# Patient Record
Sex: Female | Born: 1955 | Race: Black or African American | Hispanic: No | Marital: Married | Smoking: Former smoker
Health system: Southern US, Community
[De-identification: ages and names within clinical notes are randomized; demographics above are authoritative.]

## PROBLEM LIST (undated history)

## (undated) DIAGNOSIS — Z860100 Personal history of colon polyps, unspecified: Secondary | ICD-10-CM

## (undated) DIAGNOSIS — E7800 Pure hypercholesterolemia, unspecified: Secondary | ICD-10-CM

## (undated) DIAGNOSIS — M810 Age-related osteoporosis without current pathological fracture: Secondary | ICD-10-CM

## (undated) DIAGNOSIS — E119 Type 2 diabetes mellitus without complications: Secondary | ICD-10-CM

## (undated) DIAGNOSIS — N189 Chronic kidney disease, unspecified: Secondary | ICD-10-CM

## (undated) DIAGNOSIS — J309 Allergic rhinitis, unspecified: Secondary | ICD-10-CM

## (undated) DIAGNOSIS — I10 Essential (primary) hypertension: Secondary | ICD-10-CM

## (undated) DIAGNOSIS — Z8619 Personal history of other infectious and parasitic diseases: Secondary | ICD-10-CM

## (undated) DIAGNOSIS — F419 Anxiety disorder, unspecified: Secondary | ICD-10-CM

## (undated) DIAGNOSIS — G4730 Sleep apnea, unspecified: Secondary | ICD-10-CM

## (undated) DIAGNOSIS — E755 Other lipid storage disorders: Secondary | ICD-10-CM

## (undated) DIAGNOSIS — A4902 Methicillin resistant Staphylococcus aureus infection, unspecified site: Secondary | ICD-10-CM

## (undated) DIAGNOSIS — K219 Gastro-esophageal reflux disease without esophagitis: Secondary | ICD-10-CM

## (undated) HISTORY — DX: Type 2 diabetes mellitus without complications: E11.9

## (undated) HISTORY — PX: DILATION AND CURETTAGE OF UTERUS: SHX78

## (undated) HISTORY — DX: Anxiety disorder, unspecified: F41.9

## (undated) HISTORY — PX: TUBAL LIGATION: SHX77

## (undated) HISTORY — DX: Pure hypercholesterolemia, unspecified: E78.00

## (undated) HISTORY — DX: Essential (primary) hypertension: I10

---

## 2004-06-14 ENCOUNTER — Ambulatory Visit: Payer: Self-pay

## 2005-03-30 ENCOUNTER — Ambulatory Visit: Payer: Self-pay

## 2005-09-20 ENCOUNTER — Ambulatory Visit: Payer: Self-pay | Admitting: Gastroenterology

## 2006-01-08 ENCOUNTER — Ambulatory Visit: Payer: Self-pay | Admitting: Urology

## 2006-04-16 ENCOUNTER — Ambulatory Visit: Payer: Self-pay | Admitting: Obstetrics and Gynecology

## 2006-04-26 ENCOUNTER — Ambulatory Visit: Payer: Self-pay | Admitting: Obstetrics and Gynecology

## 2006-09-16 ENCOUNTER — Emergency Department: Payer: Self-pay | Admitting: Emergency Medicine

## 2006-09-25 ENCOUNTER — Ambulatory Visit: Payer: Self-pay | Admitting: General Practice

## 2006-12-04 ENCOUNTER — Ambulatory Visit: Payer: Self-pay

## 2008-01-27 ENCOUNTER — Ambulatory Visit: Payer: Self-pay

## 2008-08-17 LAB — HM COLONOSCOPY

## 2009-03-16 ENCOUNTER — Ambulatory Visit: Payer: Self-pay

## 2009-03-28 ENCOUNTER — Ambulatory Visit: Payer: Self-pay | Admitting: Family Medicine

## 2009-03-31 ENCOUNTER — Ambulatory Visit: Payer: Self-pay

## 2009-08-17 ENCOUNTER — Ambulatory Visit: Payer: Self-pay | Admitting: General Practice

## 2010-04-18 ENCOUNTER — Ambulatory Visit: Payer: Self-pay

## 2010-05-19 ENCOUNTER — Ambulatory Visit
Admission: RE | Admit: 2010-05-19 | Discharge: 2010-05-19 | Disposition: A | Discharge status: Home / Self Care | Payer: PRIVATE HEALTH INSURANCE | Source: Ambulatory Visit | Attending: Neurosurgery | Admitting: Neurosurgery

## 2010-05-19 ENCOUNTER — Other Ambulatory Visit: Payer: Self-pay | Admitting: Neurosurgery

## 2010-05-19 DIAGNOSIS — M5410 Radiculopathy, site unspecified: Secondary | ICD-10-CM

## 2010-05-19 DIAGNOSIS — M549 Dorsalgia, unspecified: Secondary | ICD-10-CM

## 2010-07-05 ENCOUNTER — Other Ambulatory Visit (HOSPITAL_BASED_OUTPATIENT_CLINIC_OR_DEPARTMENT_OTHER): Payer: Self-pay | Admitting: Neurosurgery

## 2010-07-05 ENCOUNTER — Ambulatory Visit (HOSPITAL_COMMUNITY)
Admission: RE | Admit: 2010-07-05 | Discharge: 2010-07-05 | Disposition: A | Discharge status: Home / Self Care | Payer: PRIVATE HEALTH INSURANCE | Source: Ambulatory Visit | Attending: Neurosurgery | Admitting: Neurosurgery

## 2010-07-05 ENCOUNTER — Encounter (HOSPITAL_COMMUNITY)
Admission: RE | Admit: 2010-07-05 | Discharge: 2010-07-05 | Disposition: A | Discharge status: Home / Self Care | Payer: PRIVATE HEALTH INSURANCE | Source: Ambulatory Visit | Attending: Neurosurgery | Admitting: Neurosurgery

## 2010-07-05 DIAGNOSIS — Z01810 Encounter for preprocedural cardiovascular examination: Secondary | ICD-10-CM | POA: Insufficient documentation

## 2010-07-05 DIAGNOSIS — Z01811 Encounter for preprocedural respiratory examination: Secondary | ICD-10-CM

## 2010-07-05 DIAGNOSIS — Z01812 Encounter for preprocedural laboratory examination: Secondary | ICD-10-CM | POA: Insufficient documentation

## 2010-07-05 DIAGNOSIS — Z01818 Encounter for other preprocedural examination: Secondary | ICD-10-CM | POA: Insufficient documentation

## 2010-07-05 LAB — CBC
MCH: 28.9 pg (ref 26.0–34.0)
Platelets: 261 10*3/uL (ref 150–400)
RBC: 4.54 MIL/uL (ref 3.87–5.11)
WBC: 10.5 10*3/uL (ref 4.0–10.5)

## 2010-07-05 LAB — BASIC METABOLIC PANEL
Chloride: 103 mEq/L (ref 96–112)
Creatinine, Ser: 1.04 mg/dL (ref 0.4–1.2)
GFR calc Af Amer: >60 mL/min (ref >60)
GFR calc non Af Amer: 55 mL/min — ABNORMAL LOW (ref >60)
Potassium: 4 mEq/L (ref 3.5–5.1)

## 2010-07-05 LAB — SURGICAL PCR SCREEN: Staphylococcus aureus: POSITIVE — AB

## 2010-07-12 ENCOUNTER — Ambulatory Visit (HOSPITAL_COMMUNITY)
Admission: RE | Admit: 2010-07-12 | Discharge: 2010-07-13 | Disposition: A | Discharge status: Home / Self Care | Payer: PRIVATE HEALTH INSURANCE | Source: Ambulatory Visit | Attending: Neurosurgery | Admitting: Neurosurgery

## 2010-07-12 ENCOUNTER — Inpatient Hospital Stay (HOSPITAL_COMMUNITY): Payer: PRIVATE HEALTH INSURANCE

## 2010-07-12 DIAGNOSIS — Z01812 Encounter for preprocedural laboratory examination: Secondary | ICD-10-CM | POA: Insufficient documentation

## 2010-07-12 DIAGNOSIS — E669 Obesity, unspecified: Secondary | ICD-10-CM | POA: Insufficient documentation

## 2010-07-12 DIAGNOSIS — E119 Type 2 diabetes mellitus without complications: Secondary | ICD-10-CM | POA: Insufficient documentation

## 2010-07-12 DIAGNOSIS — E7800 Pure hypercholesterolemia, unspecified: Secondary | ICD-10-CM | POA: Insufficient documentation

## 2010-07-12 DIAGNOSIS — Q762 Congenital spondylolisthesis: Secondary | ICD-10-CM | POA: Insufficient documentation

## 2010-07-12 DIAGNOSIS — M7130 Other bursal cyst, unspecified site: Principal | ICD-10-CM | POA: Insufficient documentation

## 2010-07-12 HISTORY — PX: BACK SURGERY: SHX140

## 2010-07-12 LAB — GLUCOSE, CAPILLARY: Glucose-Capillary: 112 mg/dL — ABNORMAL HIGH (ref 70–99)

## 2010-07-13 LAB — GLUCOSE, CAPILLARY: Glucose-Capillary: 118 mg/dL — ABNORMAL HIGH (ref 70–99)

## 2010-07-21 ENCOUNTER — Emergency Department (HOSPITAL_COMMUNITY)
Admission: EM | Admit: 2010-07-21 | Discharge: 2010-07-21 | Disposition: A | Discharge status: Home / Self Care | Payer: PRIVATE HEALTH INSURANCE | Attending: Emergency Medicine | Admitting: Emergency Medicine

## 2010-07-21 DIAGNOSIS — I10 Essential (primary) hypertension: Secondary | ICD-10-CM | POA: Insufficient documentation

## 2010-07-21 DIAGNOSIS — Z79899 Other long term (current) drug therapy: Secondary | ICD-10-CM | POA: Insufficient documentation

## 2010-07-21 DIAGNOSIS — E119 Type 2 diabetes mellitus without complications: Secondary | ICD-10-CM | POA: Insufficient documentation

## 2010-07-21 DIAGNOSIS — M25519 Pain in unspecified shoulder: Secondary | ICD-10-CM | POA: Insufficient documentation

## 2010-07-30 NOTE — Op Note (Signed)
Lindsey Caldwell, Lindsey Caldwell              ACCOUNT NO.:  [id number]  MEDICAL RECORD NO.:  [id number]           PATIENT TYPE:  O  LOCATION:  3526                         FACILITY:  [facility]  PHYSICIAN:  Cristi Loron, M.D.DATE OF BIRTH:  07/26/1955  DATE OF PROCEDURE:  07/12/2010 DATE OF DISCHARGE:  07/13/2010                              OPERATIVE REPORT   BRIEF HISTORY:  The patient is a 55 year old black female who has suffered from back and left leg pain consistent with a lumbar radiculopathy.  She failed medical management and was worked up with a lumbar MRI, which demonstrated several cysts at L4-5 on the left.  I discussed various treatment options with the patient including surgery. She has weighed the risks, benefits, and alternatives of surgery, and decided to proceed with a left L4 laminotomy to decompress the left L4 and L5 nerve roots.  PREOPERATIVE DIAGNOSES:  L4-5 spondylolisthesis, synovial cyst,  spinal stenosis, lumbar radiculopathy, lumbago.  POSTOPERATIVE DIAGNOSES:  L4-5 spondylolisthesis, synovial cyst,  spinal stenosis, lumbar radiculopathy, lumbago.  PROCEDURE:  Left L4 laminotomy and foraminotomy with removal of synovial cyst using microdissection to decompress the left L4 and L5 nerve roots.  SURGEON:  Cristi Loron, MD  ASSISTANT:  Stefani Dama, MD  ANESTHESIA:  General endotracheal.  ESTIMATED BLOOD LOSS:  50 mL.  SPECIMENS:  None.  DRAINS:  None.  COMPLICATIONS:  None.  DESCRIPTION OF PROCEDURE:  The patient was brought to the operating room by anesthesia team.  General endotracheal anesthesia was induced.  The patient was then turned to prone position on Wilson frame.  The lumbosacral region was then prepared with Betadine scrub with Betadine solution.  Sterile drapes were applied and injected the area to be incised with Marcaine with epinephrine solution, used a scalpel to make a linear midline incision over the L4-5 interspace.  I  used electrocautery to perform a left-sided subperiosteal dissection exposing the left spinous process, lamina of L4 and L5.  We obtained intraoperative radiograph to confirm the location.  We then inserted McCullough retractor for exposure.  We then brought the microscope into the field and under its magnification and illumination, we completed the microdissection/decompression.  I used a high-speed drill to perform a left L4 laminotomy.  I widened the laminotomy with Kerrison punch and removed the left L4-5 ligamentum flavum.  I removed the cephalad aspect of the left L5 lamina.  I performed a foraminotomy about the left L5 nerve root.  We did encounter typical-appearing synovial cyst.  We used microdissection to free up from the epidural tissue and removed with Kerrison punch.  I also performed a foraminotomy about the left L4 nerve root.  This completed the decompression.  I inspected the L4-5 intervertebral disk.  There was no disk herniation.  We then palpated along the ventral surface of the thecal sac along the exit route of the left L4 and L5 nerve roots and noted that well decompressed.  We obtained hemostasis using bipolar cautery and then irrigated the wound out with bacitracin solution.  We then removed the retractor and then reapproximated the patient's thoracolumbar fascia with interrupted #1 Vicryl  suture, subcutaneous tissue with interrupted 2-0 Vicryl suture, and the skin with Steri-Strips and Benzoin.  The wound was then coated with bacitracin ointment and sterile dressing applied.  The drapes were removed.  The patient was subsequently returned to supine position where she was extubated by the Anesthesia team and transported to post anesthesia care unit in stable condition.  All sponge, instrument, and needle counts were correct at the end of this case.     Cristi Loron, M.D.     JDJ/MEDQ  D:  07/13/2010  T:  07/13/2010  Job:  [id number]  Electronically  Signed by Tressie Stalker M.D. on 07/30/2010 09:51:41 PM

## 2010-10-11 ENCOUNTER — Encounter: Payer: Self-pay | Admitting: Neurosurgery

## 2010-10-21 ENCOUNTER — Encounter: Payer: Self-pay | Admitting: Neurosurgery

## 2011-03-29 ENCOUNTER — Other Ambulatory Visit: Payer: Self-pay | Admitting: Neurosurgery

## 2011-03-29 DIAGNOSIS — M549 Dorsalgia, unspecified: Secondary | ICD-10-CM

## 2011-04-03 ENCOUNTER — Ambulatory Visit
Admission: RE | Admit: 2011-04-03 | Discharge: 2011-04-03 | Disposition: A | Discharge status: Home / Self Care | Payer: PRIVATE HEALTH INSURANCE | Source: Ambulatory Visit | Attending: Neurosurgery | Admitting: Neurosurgery

## 2011-04-03 DIAGNOSIS — M549 Dorsalgia, unspecified: Secondary | ICD-10-CM

## 2011-04-03 MED ORDER — GADOBENATE DIMEGLUMINE 529 MG/ML IV SOLN
19.0000 mL | Freq: Once | INTRAVENOUS | Status: AC | PRN
  Administered 2011-04-03: 19 mL via INTRAVENOUS

## 2011-07-04 ENCOUNTER — Ambulatory Visit: Payer: Self-pay | Admitting: Family Medicine

## 2011-07-13 ENCOUNTER — Ambulatory Visit: Payer: Self-pay | Admitting: Family Medicine

## 2012-04-23 ENCOUNTER — Ambulatory Visit: Payer: Self-pay | Admitting: Family Medicine

## 2012-05-21 ENCOUNTER — Emergency Department: Payer: Self-pay | Admitting: Emergency Medicine

## 2012-06-24 ENCOUNTER — Ambulatory Visit: Payer: Self-pay | Admitting: Unknown Physician Specialty

## 2012-07-08 ENCOUNTER — Ambulatory Visit: Payer: Self-pay | Admitting: Family Medicine

## 2012-07-16 ENCOUNTER — Ambulatory Visit: Payer: Self-pay | Admitting: General Practice

## 2012-07-17 ENCOUNTER — Ambulatory Visit: Payer: Self-pay | Admitting: Family Medicine

## 2012-07-21 LAB — HM MAMMOGRAPHY: HM MAMMO: NORMAL

## 2012-08-01 ENCOUNTER — Ambulatory Visit: Payer: Self-pay | Admitting: General Practice

## 2012-08-01 HISTORY — PX: KNEE SURGERY: SHX244

## 2012-12-17 LAB — HM PAP SMEAR: HM PAP: NORMAL

## 2013-01-09 ENCOUNTER — Ambulatory Visit: Payer: Self-pay | Admitting: Family Medicine

## 2013-02-13 ENCOUNTER — Ambulatory Visit (INDEPENDENT_AMBULATORY_CARE_PROVIDER_SITE_OTHER): Payer: PRIVATE HEALTH INSURANCE | Admitting: Podiatry

## 2013-02-13 ENCOUNTER — Encounter: Payer: Self-pay | Admitting: Podiatry

## 2013-02-13 ENCOUNTER — Ambulatory Visit (INDEPENDENT_AMBULATORY_CARE_PROVIDER_SITE_OTHER): Payer: PRIVATE HEALTH INSURANCE

## 2013-02-13 VITALS — BP 128/77 | HR 109 | Resp 16 | Ht 64.0 in | Wt 235.0 lb

## 2013-02-13 DIAGNOSIS — M79672 Pain in left foot: Secondary | ICD-10-CM

## 2013-02-13 DIAGNOSIS — M722 Plantar fascial fibromatosis: Secondary | ICD-10-CM

## 2013-02-13 DIAGNOSIS — M7750 Other enthesopathy of unspecified foot: Secondary | ICD-10-CM

## 2013-02-13 DIAGNOSIS — M79609 Pain in unspecified limb: Secondary | ICD-10-CM

## 2013-02-13 MED ORDER — TRIAMCINOLONE ACETONIDE 10 MG/ML IJ SUSP: 10.0000 mg | Freq: Once | INTRAMUSCULAR | Status: DC

## 2013-02-13 NOTE — Patient Instructions (Signed)
Plantar Fasciitis (Heel Spur Syndrome) with Rehab The plantar fascia is a fibrous, ligament-like, soft-tissue structure that spans the bottom of the foot. Plantar fasciitis is a condition that causes pain in the foot due to inflammation of the tissue. SYMPTOMS   Pain and tenderness on the underneath side of the foot.  Pain that worsens with standing or walking. CAUSES  Plantar fasciitis is caused by irritation and injury to the plantar fascia on the underneath side of the foot. Common mechanisms of injury include:  Direct trauma to bottom of the foot.  Damage to a small nerve that runs under the foot where the main fascia attaches to the heel bone.  Stress placed on the plantar fascia due to bone spurs. RISK INCREASES WITH:   Activities that place stress on the plantar fascia (running, jumping, pivoting, or cutting).  Poor strength and flexibility.  Improperly fitted shoes.  Tight calf muscles.  Flat feet.  Failure to warm-up properly before activity.  Obesity. PREVENTION  Warm up and stretch properly before activity.  Allow for adequate recovery between workouts.  Maintain physical fitness:  Strength, flexibility, and endurance.  Cardiovascular fitness.  Maintain a health body weight.  Avoid stress on the plantar fascia.  Wear properly fitted shoes, including arch supports for individuals who have flat feet. PROGNOSIS  If treated properly, then the symptoms of plantar fasciitis usually resolve without surgery. However, occasionally surgery is necessary. RELATED COMPLICATIONS   Recurrent symptoms that may result in a chronic condition.  Problems of the lower back that are caused by compensating for the injury, such as limping.  Pain or weakness of the foot during push-off following surgery.  Chronic inflammation, scarring, and partial or complete fascia tear, occurring more often from repeated injections. TREATMENT  Treatment initially involves the use of  ice and medication to help reduce pain and inflammation. The use of strengthening and stretching exercises may help reduce pain with activity, especially stretches of the Achilles tendon. These exercises may be performed at home or with a therapist. Your caregiver may recommend that you use heel cups of arch supports to help reduce stress on the plantar fascia. Occasionally, corticosteroid injections are given to reduce inflammation. If symptoms persist for greater than 6 months despite non-surgical (conservative), then surgery may be recommended.  MEDICATION   If pain medication is necessary, then nonsteroidal anti-inflammatory medications, such as aspirin and ibuprofen, or other minor pain relievers, such as acetaminophen, are often recommended.  Do not take pain medication within 7 days before surgery.  Prescription pain relievers may be given if deemed necessary by your caregiver. Use only as directed and only as much as you need.  Corticosteroid injections may be given by your caregiver. These injections should be reserved for the most serious cases, because they may only be given a certain number of times. HEAT AND COLD  Cold treatment (icing) relieves pain and reduces inflammation. Cold treatment should be applied for 10 to 15 minutes every 2 to 3 hours for inflammation and pain and immediately after any activity that aggravates your symptoms. Use ice packs or massage the area with a piece of ice (ice massage).  Heat treatment may be used prior to performing the stretching and strengthening activities prescribed by your caregiver, physical therapist, or athletic trainer. Use a heat pack or soak the injury in warm water. SEEK IMMEDIATE MEDICAL CARE IF:  Treatment seems to offer no benefit, or the condition worsens.  Any medications produce adverse side effects. EXERCISES RANGE   OF MOTION (ROM) AND STRETCHING EXERCISES - Plantar Fasciitis (Heel Spur Syndrome) These exercises may help you  when beginning to rehabilitate your injury. Your symptoms may resolve with or without further involvement from your physician, physical therapist or athletic trainer. While completing these exercises, remember:   Restoring tissue flexibility helps normal motion to return to the joints. This allows healthier, less painful movement and activity.  An effective stretch should be held for at least 30 seconds.  A stretch should never be painful. You should only feel a gentle lengthening or release in the stretched tissue. RANGE OF MOTION - Toe Extension, Flexion  Sit with your right / left leg crossed over your opposite knee.  Grasp your toes and gently pull them back toward the top of your foot. You should feel a stretch on the bottom of your toes and/or foot.  Hold this stretch for __________ seconds.  Now, gently pull your toes toward the bottom of your foot. You should feel a stretch on the top of your toes and or foot.  Hold this stretch for __________ seconds. Repeat __________ times. Complete this stretch __________ times per day.  RANGE OF MOTION - Ankle Dorsiflexion, Active Assisted  Remove shoes and sit on a chair that is preferably not on a carpeted surface.  Place right / left foot under knee. Extend your opposite leg for support.  Keeping your heel down, slide your right / left foot back toward the chair until you feel a stretch at your ankle or calf. If you do not feel a stretch, slide your bottom forward to the edge of the chair, while still keeping your heel down.  Hold this stretch for __________ seconds. Repeat __________ times. Complete this stretch __________ times per day.  STRETCH  Gastroc, Standing  Place hands on wall.  Extend right / left leg, keeping the front knee somewhat bent.  Slightly point your toes inward on your back foot.  Keeping your right / left heel on the floor and your knee straight, shift your weight toward the wall, not allowing your back to  arch.  You should feel a gentle stretch in the right / left calf. Hold this position for __________ seconds. Repeat __________ times. Complete this stretch __________ times per day. STRETCH  Soleus, Standing  Place hands on wall.  Extend right / left leg, keeping the other knee somewhat bent.  Slightly point your toes inward on your back foot.  Keep your right / left heel on the floor, bend your back knee, and slightly shift your weight over the back leg so that you feel a gentle stretch deep in your back calf.  Hold this position for __________ seconds. Repeat __________ times. Complete this stretch __________ times per day. STRETCH  Gastrocsoleus, Standing  Note: This exercise can place a lot of stress on your foot and ankle. Please complete this exercise only if specifically instructed by your caregiver.   Place the ball of your right / left foot on a step, keeping your other foot firmly on the same step.  Hold on to the wall or a rail for balance.  Slowly lift your other foot, allowing your body weight to press your heel down over the edge of the step.  You should feel a stretch in your right / left calf.  Hold this position for __________ seconds.  Repeat this exercise with a slight bend in your right / left knee. Repeat __________ times. Complete this stretch __________ times per day.    STRENGTHENING EXERCISES - Plantar Fasciitis (Heel Spur Syndrome)  These exercises may help you when beginning to rehabilitate your injury. They may resolve your symptoms with or without further involvement from your physician, physical therapist or athletic trainer. While completing these exercises, remember:   Muscles can gain both the endurance and the strength needed for everyday activities through controlled exercises.  Complete these exercises as instructed by your physician, physical therapist or athletic trainer. Progress the resistance and repetitions only as guided. STRENGTH - Towel  Curls  Sit in a chair positioned on a non-carpeted surface.  Place your foot on a towel, keeping your heel on the floor.  Pull the towel toward your heel by only curling your toes. Keep your heel on the floor.  If instructed by your physician, physical therapist or athletic trainer, add ____________________ at the end of the towel. Repeat __________ times. Complete this exercise __________ times per day. STRENGTH - Ankle Inversion  Secure one end of a rubber exercise band/tubing to a fixed object (table, pole). Loop the other end around your foot just before your toes.  Place your fists between your knees. This will focus your strengthening at your ankle.  Slowly, pull your big toe up and in, making sure the band/tubing is positioned to resist the entire motion.  Hold this position for __________ seconds.  Have your muscles resist the band/tubing as it slowly pulls your foot back to the starting position. Repeat __________ times. Complete this exercises __________ times per day.  Document Released: 02/05/2005 Document Revised: 04/30/2011 Document Reviewed: 05/20/2008 [employer] Patient Information 2014 [employer]. Plantar Fasciitis Plantar fasciitis is a common condition that causes foot pain. It is soreness (inflammation) of the band of tough fibrous tissue on the bottom of the foot that runs from the heel bone (calcaneus) to the ball of the foot. The cause of this soreness may be from excessive standing, poor fitting shoes, running on hard surfaces, being overweight, having an abnormal walk, or overuse (this is common in runners) of the painful foot or feet. It is also common in aerobic exercise dancers and ballet dancers. SYMPTOMS  Most people with plantar fasciitis complain of:  Severe pain in the morning on the bottom of their foot especially when taking the first steps out of bed. This pain recedes after a few minutes of walking.  Severe pain is experienced also during walking  following a long period of inactivity.  Pain is worse when walking barefoot or up stairs DIAGNOSIS   Your caregiver will diagnose this condition by examining and feeling your foot.  Special tests such as X-rays of your foot, are usually not needed. PREVENTION   Consult a sports medicine professional before beginning a new exercise program.  Walking programs offer a good workout. With walking there is a lower chance of overuse injuries common to runners. There is less impact and less jarring of the joints.  Begin all new exercise programs slowly. If problems or pain develop, decrease the amount of time or distance until you are at a comfortable level.  Wear good shoes and replace them regularly.  Stretch your foot and the heel cords at the back of the ankle (Achilles tendon) both before and after exercise.  Run or exercise on even surfaces that are not hard. For example, asphalt is better than pavement.  Do not run barefoot on hard surfaces.  If using a treadmill, vary the incline.  Do not continue to workout if you have foot or joint   problems. Seek professional help if they do not improve. HOME CARE INSTRUCTIONS   Avoid activities that cause you pain until you recover.  Use ice or cold packs on the problem or painful areas after working out.  Only take over-the-counter or prescription medicines for pain, discomfort, or fever as directed by your caregiver.  Soft shoe inserts or athletic shoes with air or gel sole cushions may be helpful.  If problems continue or become more severe, consult a sports medicine caregiver or your own health care provider. Cortisone is a potent anti-inflammatory medication that may be injected into the painful area. You can discuss this treatment with your caregiver. MAKE SURE YOU:   Understand these instructions.  Will watch your condition.  Will get help right away if you are not doing well or get worse. Document Released: 10/31/2000 Document  Revised: 04/30/2011 Document Reviewed: 12/31/2007 [employer] Patient Information 2014 [employer].  

## 2013-02-13 NOTE — Progress Notes (Signed)
   Subjective:    Patient ID: Lindsey Caldwell, female    DOB: [birth year], 57 y.o.   MRN: [id number]  HPI Comments: Right heel pain , left top of foot pain with burning   Foot Pain      Review of Systems     Objective:   Physical Exam        Assessment & Plan:

## 2013-02-13 NOTE — Progress Notes (Signed)
Subjective:     Patient ID: Lindsey Caldwell, female   DOB: [birth year], 57 y.o.   MRN: [id number]  HPI patient presents stating that she has had problems with her heel for several months and burring on top of her left foot with pain early in the morning and after periods of sitting and now starting to hurt all day   Review of Systems     Objective:   Physical Exam Neurovascular status intact with no health history changes noted with discomfort aspect right heel at the insertional point of the tendon into the calcaneus with mild inflammation dorsal left    Assessment:     Plantar fasciitis right with possible tendinitis dorsal left with structural malalignment of the foot noted    Plan:     H&P reviewed and x-rays reviewed. Injected the plantar fascia right 3 mg Kenalog 5 mg Xylocaine Marcaine mixture and advised on do orthotics as they have worn out from several years ago. Dispensed ankle brace to support the arch and reappoint when orthotics are ready

## 2013-02-16 LAB — HM DIABETES EYE EXAM

## 2013-03-02 ENCOUNTER — Telehealth: Payer: Self-pay | Admitting: *Deleted

## 2013-03-02 NOTE — Telephone Encounter (Signed)
Called and l/m wanting to know shoe size for orthotics. [facility] is needing shoe size.

## 2013-03-23 ENCOUNTER — Encounter: Payer: Self-pay | Admitting: *Deleted

## 2013-03-23 NOTE — Progress Notes (Signed)
Pt picked up orthotics on Friday 1.30.15

## 2013-04-03 ENCOUNTER — Ambulatory Visit (INDEPENDENT_AMBULATORY_CARE_PROVIDER_SITE_OTHER): Payer: PRIVATE HEALTH INSURANCE

## 2013-04-03 ENCOUNTER — Encounter: Payer: Self-pay | Admitting: Podiatry

## 2013-04-03 ENCOUNTER — Ambulatory Visit (INDEPENDENT_AMBULATORY_CARE_PROVIDER_SITE_OTHER): Payer: PRIVATE HEALTH INSURANCE | Admitting: Podiatry

## 2013-04-03 VITALS — BP 161/95 | HR 89 | Resp 16 | Ht 64.0 in | Wt 240.0 lb

## 2013-04-03 DIAGNOSIS — M7750 Other enthesopathy of unspecified foot: Secondary | ICD-10-CM

## 2013-04-03 DIAGNOSIS — M25579 Pain in unspecified ankle and joints of unspecified foot: Secondary | ICD-10-CM

## 2013-04-03 MED ORDER — DICLOFENAC SODIUM 75 MG PO TBEC: 75.0000 mg | DELAYED_RELEASE_TABLET | Freq: Two times a day (BID) | ORAL | Status: DC

## 2013-04-03 MED ORDER — TRIAMCINOLONE ACETONIDE 10 MG/ML IJ SUSP
10.0000 mg | Freq: Once | INTRAMUSCULAR | Status: AC
  Administered 2013-04-03: 10 mg

## 2013-04-03 NOTE — Progress Notes (Signed)
Subjective:     Patient ID: Lindsey Caldwell, female   DOB: 30-Jan-1956, 58 y.o.   MRN: [id number]  HPI patient states the heel is doing pretty well but she is getting pain on the inside of the right ankle which has made it hard for her to do a lot of walking or to work comfortably. Does not remember injury   Review of Systems     Objective:   Physical Exam Neurovascular status intact with muscle strength adequate and discomfort and swelling around the posterior tibial tendon as it goes under the medial malleolus and inserts into the navicular. No indications of tear and plantar heel is doing well    Assessment:     Probable posterior tibial tendinitis right with also mild discomfort lateral ankle secondary to gait change    Plan:     Careful sheath injection of the right posterior tibial 3 mg Kenalog 5 mg Xylocaine and advised him reduced activity and supportive inserts usage. Reappoint her recheck in the next several weeks

## 2013-04-17 ENCOUNTER — Ambulatory Visit: Payer: PRIVATE HEALTH INSURANCE | Admitting: Podiatry

## 2013-04-24 ENCOUNTER — Ambulatory Visit: Payer: PRIVATE HEALTH INSURANCE | Admitting: Podiatry

## 2013-06-17 ENCOUNTER — Telehealth: Payer: Self-pay | Admitting: *Deleted

## 2013-06-17 NOTE — Telephone Encounter (Signed)
PT CALLED SAID ORTHOTICS ARE TEARING AND SHE JUST PICKED THEM UP ON 1.13.15. TOLD PT TO BRING THEM BY OFFICE AND WE WILL SEND THEM OUT TO BE REPAIRED. PT SAID SHE WOULD BRING THEM BY ON Monday.

## 2013-07-06 ENCOUNTER — Encounter: Payer: Self-pay | Admitting: *Deleted

## 2013-07-06 NOTE — Progress Notes (Signed)
Sent pt post card letting her know orthotics are here. 

## 2013-07-10 ENCOUNTER — Encounter: Payer: Self-pay | Admitting: Podiatry

## 2013-07-23 ENCOUNTER — Ambulatory Visit: Payer: PRIVATE HEALTH INSURANCE | Admitting: Internal Medicine

## 2013-08-17 ENCOUNTER — Ambulatory Visit (INDEPENDENT_AMBULATORY_CARE_PROVIDER_SITE_OTHER): Payer: PRIVATE HEALTH INSURANCE | Admitting: Internal Medicine

## 2013-08-17 ENCOUNTER — Encounter: Payer: Self-pay | Admitting: Internal Medicine

## 2013-08-17 VITALS — BP 142/76 | HR 96 | Temp 98.5°F | Ht 63.5 in | Wt 246.8 lb

## 2013-08-17 DIAGNOSIS — M5442 Lumbago with sciatica, left side: Secondary | ICD-10-CM | POA: Insufficient documentation

## 2013-08-17 DIAGNOSIS — I10 Essential (primary) hypertension: Secondary | ICD-10-CM | POA: Insufficient documentation

## 2013-08-17 DIAGNOSIS — G4700 Insomnia, unspecified: Secondary | ICD-10-CM | POA: Insufficient documentation

## 2013-08-17 DIAGNOSIS — E119 Type 2 diabetes mellitus without complications: Secondary | ICD-10-CM | POA: Insufficient documentation

## 2013-08-17 DIAGNOSIS — M545 Low back pain, unspecified: Secondary | ICD-10-CM

## 2013-08-17 DIAGNOSIS — K219 Gastro-esophageal reflux disease without esophagitis: Secondary | ICD-10-CM

## 2013-08-17 MED ORDER — DEXLANSOPRAZOLE 60 MG PO CPDR: 60.0000 mg | DELAYED_RELEASE_CAPSULE | Freq: Every day | ORAL | Status: DC

## 2013-08-17 MED ORDER — ESZOPICLONE 3 MG PO TABS: 3.0000 mg | ORAL_TABLET | Freq: Every day | ORAL | Status: DC

## 2013-08-17 MED ORDER — METFORMIN HCL 500 MG PO TABS: 500.0000 mg | ORAL_TABLET | Freq: Every day | ORAL | Status: DC

## 2013-08-17 NOTE — Assessment & Plan Note (Signed)
Low back pain, s/p surgical resection of synovial cyst in the past. Follow up with neurosurgery pending. Continue prn flexeril.

## 2013-08-17 NOTE — Assessment & Plan Note (Signed)
Symptoms well controlled with Dexilant. Will continue. 

## 2013-08-17 NOTE — Assessment & Plan Note (Signed)
Symptoms poorly controlled on Ambien. Will try change to [facility]. 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 Follow up in 4 weeks.

## 2013-08-17 NOTE — Progress Notes (Signed)
Pre visit review using our clinic review tool, if applicable. No additional management support is needed unless otherwise documented below in the visit note. 

## 2013-08-17 NOTE — Patient Instructions (Signed)
Stop Ambien.  Start Lunesta 3mg  at bedtime.  Follow up 4 weeks and sooner as needed.

## 2013-08-17 NOTE — Assessment & Plan Note (Signed)
BP Readings from Last 3 Encounters:  08/17/13 142/76  04/03/13 161/95  02/13/13 128/77   BP well controlled per pt report. Will check renal function with labs. Will continue Avalide.

## 2013-08-17 NOTE — Assessment & Plan Note (Signed)
Wt Readings from Last 3 Encounters:  08/17/13 246 lb 12 oz (111.925 kg)  04/03/13 240 lb (108.863 kg)  02/13/13 235 lb (106.595 kg)   Body mass index is 43.02 kg/(m^2). Encouraged healthy diet and regular exercise. Will set up referral with surgery to discuss gastric bypass or sleeve.

## 2013-08-17 NOTE — Assessment & Plan Note (Signed)
Will check A1c with labs. Eye exam UTD. Foot exam normal today. Continue Metformin. Will request previous records including vaccination records.

## 2013-08-17 NOTE — Progress Notes (Signed)
Subjective:    Patient ID: Lindsey Caldwell, female    DOB: April 24, 1955, 58 y.o.   MRN: [id number]  HPI 58YO female presents to established care.  DM - A1c last check 6.1%. Compliant with meds. Diagnosed about 10 years ago.   Obesity - trying to lose weight, but has struggled despite efforts at healthy diet. Does not exercise. Interested in considering bariatric surgery.  Insomnia - working 7 days on 7 nights on. Has trouble sleeping. Taking Ambien 10mg  daily but only sleeps about 4 hours.  HTN - 130/70s. Compliant with meds.  Review of Systems  Constitutional: Negative for fever, chills, appetite change, fatigue and unexpected weight change.  Eyes: Negative for visual disturbance.  Respiratory: Negative for shortness of breath.   Cardiovascular: Negative for chest pain and leg swelling.  Gastrointestinal: Negative for nausea, vomiting, abdominal pain, diarrhea and constipation.  Musculoskeletal: Positive for arthralgias, back pain and myalgias.  Skin: Negative for color change and rash.  Hematological: Negative for adenopathy. Does not bruise/bleed easily.  Psychiatric/Behavioral: Positive for sleep disturbance. Negative for dysphoric mood. The patient is not nervous/anxious.        Objective:    BP 142/76  Pulse 96  Temp(Src) 98.5 F (36.9 C) (Oral)  Ht 5' 3.5" (1.613 m)  Wt 246 lb 12 oz (111.925 kg)  BMI 43.02 kg/m2  SpO2 94% Physical Exam  Constitutional: She is oriented to person, place, and time. She appears well-developed and well-nourished. No distress.  HENT:  Head: Normocephalic and atraumatic.  Right Ear: External ear normal.  Left Ear: External ear normal.  Nose: Nose normal.  Mouth/Throat: Oropharynx is clear and moist. No oropharyngeal exudate.  Eyes: Conjunctivae and EOM are normal. Pupils are equal, round, and reactive to light. Right eye exhibits no discharge. Left eye exhibits no discharge. No scleral icterus.  Neck: Normal range of motion. Neck supple.  No tracheal deviation present. No thyromegaly present.  Cardiovascular: Normal rate, regular rhythm, normal heart sounds and intact distal pulses.  Exam reveals no gallop and no friction rub.   No murmur heard. Pulmonary/Chest: Effort normal and breath sounds normal. No accessory muscle usage. Not tachypneic. No respiratory distress. She has no decreased breath sounds. She has no wheezes. She has no rhonchi. She has no rales. She exhibits no tenderness.  Abdominal: Soft. Bowel sounds are normal. She exhibits no distension and no mass. There is no tenderness. There is no rebound and no guarding.  Musculoskeletal: She exhibits no edema.       Lumbar back: She exhibits decreased range of motion, tenderness and pain.       Back:  Lymphadenopathy:    She has no cervical adenopathy.  Neurological: She is alert and oriented to person, place, and time. No cranial nerve deficit. She exhibits normal muscle tone. Coordination normal.  Skin: Skin is warm and dry. No rash noted. She is not diaphoretic. No erythema. No pallor.  Psychiatric: She has a normal mood and affect. Her behavior is normal. Judgment and thought content normal.          Assessment & Plan:   Problem List Items Addressed This Visit     Unprioritized   Diabetes type 2, controlled - Primary     Will check A1c with labs. Eye exam UTD. Foot exam normal today. Continue Metformin. Will request previous records including vaccination records.    Relevant Medications      metFORMIN (GLUCOPHAGE) tablet   Gastroesophageal reflux disease without esophagitis  Symptoms well controlled with Dexilant. Will continue.    Relevant Medications      dexlansoprazole (DEXILANT) 60 MG capsule   Hypertension      BP Readings from Last 3 Encounters:  08/17/13 142/76  04/03/13 161/95  02/13/13 128/77   BP well controlled per pt report. Will check renal function with labs. Will continue Avalide.    Insomnia     Symptoms poorly controlled  on Ambien. Will try change to [facility]. 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 Follow up in 4 weeks.    Relevant Medications      Eszopiclone (ESZOPICLONE) 3 MG TABS   Left-sided low back pain without sciatica     Low back pain, s/p surgical resection of synovial cyst in the past. Follow up with neurosurgery pending. Continue prn flexeril.    Relevant Medications      cyclobenzaprine (FLEXERIL) 10 MG tablet   Morbid obesity      Wt Readings from Last 3 Encounters:  08/17/13 246 lb 12 oz (111.925 kg)  04/03/13 240 lb (108.863 kg)  02/13/13 235 lb (106.595 kg)   Body mass index is 43.02 kg/(m^2). Encouraged healthy diet and regular exercise. Will set up referral with surgery to discuss gastric bypass or sleeve.    Relevant Medications      metFORMIN (GLUCOPHAGE) tablet   Other Relevant Orders      Ambulatory referral to General Surgery       Return in about 4 weeks (around 09/14/2013) for Recheck.

## 2013-08-26 LAB — HEPATIC FUNCTION PANEL
ALT: 26 U/L (ref 7–35)
AST: 25 U/L (ref 13–35)
Alkaline Phosphatase: 83 U/L (ref 25–125)
Bilirubin, Total: 0.5 mg/dL

## 2013-08-26 LAB — LIPID PANEL
CHOLESTEROL: 160 mg/dL (ref 0–200)
HDL: 40 mg/dL (ref 35–70)
LDL Cholesterol: 97 mg/dL
Triglycerides: 114 mg/dL (ref 40–160)

## 2013-08-26 LAB — BASIC METABOLIC PANEL
BUN: 17 mg/dL (ref 4–21)
Creatinine: 1.3 mg/dL — AB (ref <=1.1)
Glucose: 99 mg/dL
Potassium: 3.9 mmol/L (ref 3.4–5.3)
Sodium: 139 mmol/L (ref 137–147)

## 2013-08-26 LAB — HEMOGLOBIN A1C: Hgb A1c MFr Bld: 5.9 % (ref 4.0–6.0)

## 2013-08-26 LAB — TSH: TSH: 1.5 u[IU]/mL (ref <=5.90)

## 2013-08-27 ENCOUNTER — Telehealth: Payer: Self-pay | Admitting: Internal Medicine

## 2013-08-27 NOTE — Telephone Encounter (Signed)
Labs from 5/9 show decrease in kidney function with Cr 1.32. I do not have recent labs to compare to see if this is stable for her. I would like for her to hold her Metformin for now.  -Does she follow with a kidney specialist? -We need to repeat BMP in 2 weeks

## 2013-08-28 NOTE — Telephone Encounter (Signed)
Notified pt. 

## 2013-08-31 ENCOUNTER — Encounter: Payer: Self-pay | Admitting: *Deleted

## 2013-09-01 ENCOUNTER — Encounter: Payer: Self-pay | Admitting: *Deleted

## 2013-09-01 NOTE — Progress Notes (Signed)
Chart reviewed for diabetic bundle. Pt has appt currently scheduled for 09/23/13, BP will be rechecked at that appt. a1c and LDL within parameters.

## 2013-09-15 ENCOUNTER — Ambulatory Visit: Payer: Self-pay | Admitting: Family Medicine

## 2013-09-16 LAB — HM MAMMOGRAPHY: HM Mammogram: NEGATIVE

## 2013-09-16 LAB — BASIC METABOLIC PANEL
BUN: 24 mg/dL — AB (ref 4–21)
CREATININE: 1.4 mg/dL — AB (ref 0.5–1.1)
Glucose: 109 mg/dL
POTASSIUM: 4.4 mmol/L (ref 3.4–5.3)
Sodium: 138 mmol/L (ref 137–147)

## 2013-09-17 ENCOUNTER — Encounter: Payer: Self-pay | Admitting: *Deleted

## 2013-09-23 ENCOUNTER — Encounter: Payer: Self-pay | Admitting: Internal Medicine

## 2013-09-23 ENCOUNTER — Ambulatory Visit (INDEPENDENT_AMBULATORY_CARE_PROVIDER_SITE_OTHER): Payer: PRIVATE HEALTH INSURANCE | Admitting: Internal Medicine

## 2013-09-23 VITALS — BP 120/66 | HR 100 | Temp 98.7°F | Ht 63.5 in | Wt 248.8 lb

## 2013-09-23 DIAGNOSIS — B9789 Other viral agents as the cause of diseases classified elsewhere: Secondary | ICD-10-CM

## 2013-09-23 DIAGNOSIS — N183 Chronic kidney disease, stage 3 unspecified: Secondary | ICD-10-CM

## 2013-09-23 DIAGNOSIS — E119 Type 2 diabetes mellitus without complications: Secondary | ICD-10-CM

## 2013-09-23 DIAGNOSIS — J069 Acute upper respiratory infection, unspecified: Secondary | ICD-10-CM

## 2013-09-23 DIAGNOSIS — I10 Essential (primary) hypertension: Secondary | ICD-10-CM

## 2013-09-23 MED ORDER — BENZONATATE 200 MG PO CAPS: 200.0000 mg | ORAL_CAPSULE | Freq: Two times a day (BID) | ORAL | Status: DC | PRN

## 2013-09-23 NOTE — Progress Notes (Signed)
Pre visit review using our clinic review tool, if applicable. No additional management support is needed unless otherwise documented below in the visit note. 

## 2013-09-23 NOTE — Assessment & Plan Note (Signed)
Lab Results  Component Value Date   HGBA1C 5.9 08/26/2013   BG well controlled off Metformin. Will continue to monitor closely and repeat A1c in 3 months.

## 2013-09-23 NOTE — Progress Notes (Signed)
Subjective:    Patient ID: Lindsey Caldwell, female    DOB: Aug 16, 1955, 58 y.o.   MRN: [id number]  HPI 58YO female presents for follow up.  DM - Stopped metformin. BG near 115-125. Had single reading of 190.  Trying to follow a healthier diet and start exercising by swimming.  Concerned about slight decline in kidney function noted on labs. Stopped taking Metformin and Allopurinol.  Recent symptoms of clear nasal drainage and dry cough about 2 days. No fever, chills, dyspnea. No known sick contacts.  Review of Systems  Constitutional: Negative for fever, chills, appetite change, fatigue and unexpected weight change.  Eyes: Negative for visual disturbance.  Respiratory: Negative for shortness of breath.   Cardiovascular: Negative for chest pain and leg swelling.  Gastrointestinal: Negative for abdominal pain.  Skin: Negative for color change and rash.  Hematological: Negative for adenopathy. Does not bruise/bleed easily.  Psychiatric/Behavioral: Negative for dysphoric mood. The patient is not nervous/anxious.        Objective:    BP 120/66  Pulse 100  Temp(Src) 98.7 F (37.1 C) (Oral)  Ht 5' 3.5" (1.613 m)  Wt 248 lb 12 oz (112.832 kg)  BMI 43.37 kg/m2  SpO2 96% Physical Exam  Constitutional: She is oriented to person, place, and time. She appears well-developed and well-nourished. No distress.  HENT:  Head: Normocephalic and atraumatic.  Right Ear: External ear normal.  Left Ear: External ear normal.  Nose: Nose normal.  Mouth/Throat: Oropharynx is clear and moist. No oropharyngeal exudate.  Eyes: Conjunctivae are normal. Pupils are equal, round, and reactive to light. Right eye exhibits no discharge. Left eye exhibits no discharge. No scleral icterus.  Neck: Normal range of motion. Neck supple. No tracheal deviation present. No thyromegaly present.  Cardiovascular: Normal rate, regular rhythm, normal heart sounds and intact distal pulses.  Exam reveals no gallop and no  friction rub.   No murmur heard. Pulmonary/Chest: Effort normal and breath sounds normal. No accessory muscle usage. Not tachypneic. No respiratory distress. She has no decreased breath sounds. She has no wheezes. She has no rhonchi. She has no rales. She exhibits no tenderness.  Musculoskeletal: Normal range of motion. She exhibits no edema and no tenderness.  Lymphadenopathy:    She has no cervical adenopathy.  Neurological: She is alert and oriented to person, place, and time. No cranial nerve deficit. She exhibits normal muscle tone. Coordination normal.  Skin: Skin is warm and dry. No rash noted. She is not diaphoretic. No erythema. No pallor.  Psychiatric: She has a normal mood and affect. Her behavior is normal. Judgment and thought content normal.          Assessment & Plan:   Problem List Items Addressed This Visit     Unprioritized   Chronic kidney disease (CKD), stage III (moderate)     Reviewed kidney function from previous labs in 2014 through present. Cr baseline 1.3-1.4. GFR near 40-55. Stopped Metformin. Stopped Allopurinol. Discussed stopping HCTZ, but will hold for now. Encouraged her to stop taking Ibuprofen. Follow up renal function in 3 months.    Diabetes type 2, controlled - Primary      Lab Results  Component Value Date   HGBA1C 5.9 08/26/2013   BG well controlled off Metformin. Will continue to monitor closely and repeat A1c in 3 months.    Relevant Orders      Ambulatory referral to diabetic education   Hypertension      BP Readings from Last 3  Encounters:  09/23/13 120/66  08/17/13 142/76  04/03/13 161/95   BP well controlled with current medications. Will continue.    Morbid obesity      Wt Readings from Last 3 Encounters:  09/23/13 248 lb 12 oz (112.832 kg)  08/17/13 246 lb 12 oz (111.925 kg)  04/03/13 240 lb (108.863 kg)   Body mass index is 43.37 kg/(m^2). Encouraged healthy diet and exercise such as swimming with goal of weight loss.      Viral URI with cough     Symptoms most consistent with Viral URI with cough. Encouraged rest and adequate fluid intake. Will start Tessalon as needed for cough. Follow up if symptoms are not improving.    Relevant Medications      benzonatate (TESSALON) capsule       Return in about 3 months (around 12/24/2013) for Recheck of Diabetes.

## 2013-09-23 NOTE — Assessment & Plan Note (Signed)
Symptoms most consistent with Viral URI with cough. Encouraged rest and adequate fluid intake. Will start Tessalon as needed for cough. Follow up if symptoms are not improving.

## 2013-09-23 NOTE — Assessment & Plan Note (Signed)
Wt Readings from Last 3 Encounters:  09/23/13 248 lb 12 oz (112.832 kg)  08/17/13 246 lb 12 oz (111.925 kg)  04/03/13 240 lb (108.863 kg)   Body mass index is 43.37 kg/(m^2). Encouraged healthy diet and exercise such as swimming with goal of weight loss.

## 2013-09-23 NOTE — Assessment & Plan Note (Signed)
BP Readings from Last 3 Encounters:  09/23/13 120/66  08/17/13 142/76  04/03/13 161/95   BP well controlled with current medications. Will continue.

## 2013-09-23 NOTE — Patient Instructions (Addendum)
Stop Ibuprofen.  Use Tylenol 1000mg  up to three times daily for back pain.  Use Flexeril as needed for back spasm at night.  Use Tessalon up to twice daily for cough. Call if symptoms are not improving by the end of this week.  Repeat labs in 3 months.

## 2013-09-23 NOTE — Assessment & Plan Note (Signed)
Reviewed kidney function from previous labs in 2014 through present. Cr baseline 1.3-1.4. GFR near 40-55. Stopped Metformin. Stopped Allopurinol. Discussed stopping HCTZ, but will hold for now. Encouraged her to stop taking Ibuprofen. Follow up renal function in 3 months.

## 2013-09-24 ENCOUNTER — Telehealth: Payer: Self-pay | Admitting: Internal Medicine

## 2013-09-24 NOTE — Telephone Encounter (Signed)
Relevant patient education assigned to patient using Emmi. ° °

## 2013-09-25 ENCOUNTER — Encounter: Payer: Self-pay | Admitting: Internal Medicine

## 2013-10-29 ENCOUNTER — Other Ambulatory Visit: Payer: Self-pay | Admitting: *Deleted

## 2013-10-29 MED ORDER — IRBESARTAN-HYDROCHLOROTHIAZIDE 150-12.5 MG PO TABS: 1.0000 | ORAL_TABLET | Freq: Every day | ORAL | Status: DC

## 2013-11-09 ENCOUNTER — Encounter: Payer: Self-pay | Admitting: *Deleted

## 2013-12-25 ENCOUNTER — Ambulatory Visit: Payer: PRIVATE HEALTH INSURANCE | Admitting: Internal Medicine

## 2014-01-13 LAB — BASIC METABOLIC PANEL
BUN: 18 mg/dL (ref 4–21)
Creatinine: 1.2 mg/dL — AB (ref 0.5–1.1)
Glucose: 87 mg/dL
Potassium: 3.8 mmol/L (ref 3.4–5.3)
Sodium: 141 mmol/L (ref 137–147)

## 2014-01-13 LAB — HEPATIC FUNCTION PANEL
ALT: 25 U/L (ref 7–35)
AST: 22 U/L (ref 13–35)
Alkaline Phosphatase: 84 U/L (ref 25–125)

## 2014-01-13 LAB — LIPID PANEL
Cholesterol: 177 mg/dL (ref 0–200)
HDL: 43 mg/dL (ref 35–70)
TRIGLYCERIDES: 100 mg/dL (ref 40–160)

## 2014-01-13 LAB — HEMOGLOBIN A1C: Hgb A1c MFr Bld: 6.2 % — AB (ref 4.0–6.0)

## 2014-01-19 ENCOUNTER — Encounter: Payer: PRIVATE HEALTH INSURANCE | Admitting: Internal Medicine

## 2014-01-25 ENCOUNTER — Encounter: Payer: Self-pay | Admitting: *Deleted

## 2014-01-25 ENCOUNTER — Ambulatory Visit (INDEPENDENT_AMBULATORY_CARE_PROVIDER_SITE_OTHER): Payer: PRIVATE HEALTH INSURANCE | Admitting: Internal Medicine

## 2014-01-25 ENCOUNTER — Encounter: Payer: Self-pay | Admitting: Internal Medicine

## 2014-01-25 VITALS — BP 135/83 | HR 93 | Temp 98.4°F | Ht 64.0 in | Wt 249.2 lb

## 2014-01-25 DIAGNOSIS — B3731 Acute candidiasis of vulva and vagina: Secondary | ICD-10-CM | POA: Insufficient documentation

## 2014-01-25 DIAGNOSIS — B373 Candidiasis of vulva and vagina: Secondary | ICD-10-CM | POA: Insufficient documentation

## 2014-01-25 DIAGNOSIS — R069 Unspecified abnormalities of breathing: Secondary | ICD-10-CM | POA: Insufficient documentation

## 2014-01-25 DIAGNOSIS — Z0000 Encounter for general adult medical examination without abnormal findings: Secondary | ICD-10-CM

## 2014-01-25 DIAGNOSIS — I10 Essential (primary) hypertension: Secondary | ICD-10-CM

## 2014-01-25 DIAGNOSIS — E119 Type 2 diabetes mellitus without complications: Secondary | ICD-10-CM

## 2014-01-25 DIAGNOSIS — N183 Chronic kidney disease, stage 3 unspecified: Secondary | ICD-10-CM

## 2014-01-25 DIAGNOSIS — R0683 Snoring: Secondary | ICD-10-CM | POA: Insufficient documentation

## 2014-01-25 DIAGNOSIS — N189 Chronic kidney disease, unspecified: Secondary | ICD-10-CM | POA: Insufficient documentation

## 2014-01-25 MED ORDER — NYSTATIN-TRIAMCINOLONE 100000-0.1 UNIT/GM-% EX OINT: 1.0000 "application " | TOPICAL_OINTMENT | Freq: Two times a day (BID) | CUTANEOUS | Status: DC

## 2014-01-25 NOTE — Progress Notes (Signed)
Subjective:    Patient ID: Lindsey Caldwell, female    DOB: 05/02/1955, 57 y.o.   MRN: [id number]  HPI 58YO female presents for physical exam.  Concerned about kidney function. Labs show decline in kidney function with GFR near 50 since 2012, however she has never been evaluated for this. No family history of CKD.  Also worried about sleep apnea. Husband notes she snores despite using CPAP. Would like repeat sleep study.   Compliant with medication Working on improving diet and exercise with goal of weight loss.   Past medical, surgical, family and social history per today's encounter.  Review of Systems  Constitutional: Negative for fever, chills, appetite change, fatigue and unexpected weight change.  Eyes: Negative for visual disturbance.  Respiratory: Negative for shortness of breath.   Cardiovascular: Negative for chest pain, palpitations and leg swelling.  Gastrointestinal: Negative for nausea, vomiting, abdominal pain, diarrhea and constipation.  Musculoskeletal: Negative for myalgias and arthralgias.  Skin: Negative for color change and rash.  Hematological: Negative for adenopathy. Does not bruise/bleed easily.  Psychiatric/Behavioral: Positive for sleep disturbance. Negative for dysphoric mood. The patient is not nervous/anxious.        Objective:    BP 135/83 mmHg  Pulse 93  Temp(Src) 98.4 F (36.9 C) (Oral)  Ht 5\' 4"  (1.626 m)  Wt 249 lb 4 oz (113.059 kg)  BMI 42.76 kg/m2  SpO2 95% Physical Exam  Constitutional: She is oriented to person, place, and time. She appears well-developed and well-nourished. No distress.  HENT:  Head: Normocephalic and atraumatic.  Right Ear: External ear normal.  Left Ear: External ear normal.  Nose: Nose normal.  Mouth/Throat: Oropharynx is clear and moist. No oropharyngeal exudate.  Eyes: Conjunctivae are normal. Pupils are equal, round, and reactive to light. Right eye exhibits no discharge. Left eye exhibits no discharge. No  scleral icterus.  Neck: Normal range of motion. Neck supple. No tracheal deviation present. No thyromegaly present.  Cardiovascular: Normal rate, regular rhythm, normal heart sounds and intact distal pulses.  Exam reveals no gallop and no friction rub.   No murmur heard. Pulmonary/Chest: Effort normal and breath sounds normal. No accessory muscle usage. No tachypnea. No respiratory distress. She has no decreased breath sounds. She has no wheezes. She has no rales. She exhibits no tenderness. Right breast exhibits no inverted nipple, no mass, no nipple discharge, no skin change and no tenderness. Left breast exhibits no inverted nipple, no mass, no nipple discharge, no skin change and no tenderness. Breasts are symmetrical.  Abdominal: Soft. Bowel sounds are normal. She exhibits no distension and no mass. There is no tenderness. There is no rebound and no guarding.  Musculoskeletal: Normal range of motion. She exhibits no edema or tenderness.  Lymphadenopathy:    She has no cervical adenopathy.  Neurological: She is alert and oriented to person, place, and time. No cranial nerve deficit. She exhibits normal muscle tone. Coordination normal.  Skin: Skin is warm and dry. No rash noted. She is not diaphoretic. There is erythema. No pallor.     Psychiatric: She has a normal mood and affect. Her behavior is normal. Judgment and thought content normal.          Assessment & Plan:   Problem List Items Addressed This Visit      Unprioritized   Chronic kidney disease (CKD), stage III (moderate)    Lab Results  Component Value Date   CREATININE 1.2* 01/13/2014   We reviewed renal function as far  back at 2012. She has stopped nephrotoxic meds. Will set up nephrology evaluation for additional evaluation and possible [place].    RESOLVED: CKD (chronic kidney disease)   Relevant Orders      Ambulatory referral to Nephrology   Diabetes type 2, controlled    Recent A1c 6.2%. Continue diet control of  blood sugars. We stopped metformin at previous visit because of renal dysfunction.    Hypertension    BP Readings from Last 3 Encounters:  01/25/14 135/83  09/23/13 120/66  08/17/13 142/76   BP well controlled as current medications. Recent renal function stable.    Morbid obesity    Wt Readings from Last 3 Encounters:  01/25/14 249 lb 4 oz (113.059 kg)  09/23/13 248 lb 12 oz (112.832 kg)  08/17/13 246 lb 12 oz (111.925 kg)   Body mass index is 42.76 kg/(m^2). Encouraged healthy diet and exercise with goal of weight loss.    Routine general medical examination at a health care facility - Primary    General medical exam normal today including breast exam except as noted. Mammogram UTD and reviewed. PAP and pelvic deferred as PAP normal 2014, requested report. Colonoscopy UTD. Recommended Pneumovax, however pt declined. Encouraged healthy diet and exercise. Reviewed recent labs. Follow up 3 months and prn.    Snoring    Given ongoing snoring on CPAP, will set up follow up sleep study with CPAP titration. Results of previous study not available. She reports this was many years ago.    Relevant Orders      Ambulatory referral to Sleep Studies   Vagina, candidiasis    Mild external candidiasis noted on exam. Will start topical nystatin-triamcinolone. She will call if symptoms are not improving.    Relevant Medications      nystatin-triamcinolone (MYCOLOG) ointment       Return in about 3 months (around 04/26/2014) for Recheck of Diabetes.

## 2014-01-25 NOTE — Assessment & Plan Note (Signed)
Given ongoing snoring on CPAP, will set up follow up sleep study with CPAP titration. Results of previous study not available. She reports this was many years ago.

## 2014-01-25 NOTE — Assessment & Plan Note (Signed)
BP Readings from Last 3 Encounters:  01/25/14 135/83  09/23/13 120/66  08/17/13 142/76   BP well controlled as current medications. Recent renal function stable.

## 2014-01-25 NOTE — Assessment & Plan Note (Signed)
Wt Readings from Last 3 Encounters:  01/25/14 249 lb 4 oz (113.059 kg)  09/23/13 248 lb 12 oz (112.832 kg)  08/17/13 246 lb 12 oz (111.925 kg)   Body mass index is 42.76 kg/(m^2). Encouraged healthy diet and exercise with goal of weight loss.

## 2014-01-25 NOTE — Assessment & Plan Note (Signed)
Mild external candidiasis noted on exam. Will start topical nystatin-triamcinolone. She will call if symptoms are not improving.

## 2014-01-25 NOTE — Assessment & Plan Note (Signed)
General medical exam normal today including breast exam except as noted. Mammogram UTD and reviewed. PAP and pelvic deferred as PAP normal 2014, requested report. Colonoscopy UTD. Recommended Pneumovax, however pt declined. Encouraged healthy diet and exercise. Reviewed recent labs. Follow up 3 months and prn.

## 2014-01-25 NOTE — Progress Notes (Signed)
Pre visit review using our clinic review tool, if applicable. No additional management support is needed unless otherwise documented below in the visit note. 

## 2014-01-25 NOTE — Assessment & Plan Note (Signed)
Lab Results  Component Value Date   CREATININE 1.2* 01/13/2014   We reviewed renal function as far back at 2012. She has stopped nephrotoxic meds. Will set up nephrology evaluation for additional evaluation and possible [place].

## 2014-01-25 NOTE — Addendum Note (Signed)
Addended by: Wynonia Lawman E on: 01/25/2014 10:14 AM   Modules accepted: Orders

## 2014-01-25 NOTE — Assessment & Plan Note (Signed)
Recent A1c 6.2%. Continue diet control of blood sugars. We stopped metformin at previous visit because of renal dysfunction.

## 2014-01-25 NOTE — Patient Instructions (Signed)

## 2014-01-25 NOTE — Progress Notes (Signed)
Resent Rx after failed transmission

## 2014-02-02 ENCOUNTER — Encounter: Payer: Self-pay | Admitting: Internal Medicine

## 2014-02-15 ENCOUNTER — Other Ambulatory Visit: Payer: Self-pay

## 2014-02-15 MED ORDER — LOVASTATIN ER 20 MG PO TB24: 20.0000 mg | ORAL_TABLET | Freq: Every day | ORAL | Status: DC

## 2014-02-15 NOTE — Telephone Encounter (Signed)
Paper requested filled.

## 2014-02-25 ENCOUNTER — Encounter: Payer: PRIVATE HEALTH INSURANCE | Admitting: Internal Medicine

## 2014-03-01 ENCOUNTER — Telehealth: Payer: Self-pay | Admitting: Internal Medicine

## 2014-03-01 NOTE — Telephone Encounter (Signed)
Fine to send order over

## 2014-03-01 NOTE — Telephone Encounter (Signed)
Called [phone], left message that MD ok'd request to re-calibrate CPAP machine.

## 2014-03-01 NOTE — Telephone Encounter (Signed)
Needing an order sent to [facility] to have the patient's CPAP machine re calibrated.# [phone]

## 2014-03-01 NOTE — Telephone Encounter (Signed)
Left message requesting pt to return call with [facility] phone number.  Closing message until pt returns call

## 2014-03-02 ENCOUNTER — Telehealth: Payer: Self-pay | Admitting: *Deleted

## 2014-03-02 NOTE — Telephone Encounter (Signed)
Eric from [facility] called states the pressure change for pts CPap machine is based on the last sleep study.  I spoke with the pt, she has a copy.  She will get it to the office.  Once received, order needs to be placed for CPAP pressure change based on the results.

## 2014-03-03 ENCOUNTER — Telehealth: Payer: Self-pay | Admitting: Internal Medicine

## 2014-03-03 NOTE — Telephone Encounter (Signed)
[facility] called to stated that pt doesn't get her sleep equipment from them and that pt told them to she goes to another md for her equipment/msn

## 2014-03-05 NOTE — Telephone Encounter (Signed)
Didn't we send this in?

## 2014-03-05 NOTE — Telephone Encounter (Signed)
Pt called requesting the status of her Cpap setting order.  Pt states she had her last sleep study at [facility].

## 2014-03-11 NOTE — Telephone Encounter (Signed)
Left messages for pt to return my call.

## 2014-03-19 ENCOUNTER — Telehealth: Payer: Self-pay | Admitting: Internal Medicine

## 2014-03-19 NOTE — Telephone Encounter (Signed)
Sleep study results given Nitchia she put them at Dr. Thomes Dinning station.

## 2014-03-26 ENCOUNTER — Telehealth: Payer: Self-pay

## 2014-03-26 NOTE — Telephone Encounter (Signed)
The patient called and is hoping to find out more information about her c-pap results.  She stated she is hoping her c-pap can be changed from 11 to 82, but the sleep study company needs an order.

## 2014-03-26 NOTE — Telephone Encounter (Signed)
[facility] is the name of the company pt wants to use for CPAP supplies.  Received and completed order form.

## 2014-03-26 NOTE — Telephone Encounter (Signed)
Called [phone], spoke with a representative who is faxing over an order form for [place] to complete.

## 2014-03-31 ENCOUNTER — Encounter: Payer: Self-pay | Admitting: Internal Medicine

## 2014-04-02 ENCOUNTER — Telehealth: Payer: Self-pay | Admitting: *Deleted

## 2014-04-02 NOTE — Telephone Encounter (Signed)
Pt called again requesting status of Cpap orders.  Becky and I looked in the faxed file, we did not see the completed order from [facility].  As per 2.5.16 telephone note, you received it.  Pt is requesting you call her.

## 2014-04-02 NOTE — Telephone Encounter (Signed)
Spoke with Pam at [facility], she verified that they do have the signed order.  She further states she will contact the pt.

## 2014-04-12 ENCOUNTER — Other Ambulatory Visit: Payer: Self-pay | Admitting: Internal Medicine

## 2014-04-12 ENCOUNTER — Other Ambulatory Visit: Payer: Self-pay

## 2014-04-12 MED ORDER — ALPRAZOLAM 0.5 MG PO TABS: 0.5000 mg | ORAL_TABLET | Freq: Every evening | ORAL | Status: DC | PRN

## 2014-04-12 MED ORDER — MONTELUKAST SODIUM 10 MG PO TABS: 10.0000 mg | ORAL_TABLET | Freq: Every day | ORAL | Status: DC

## 2014-04-12 NOTE — Telephone Encounter (Signed)
Rx faxed to pharmacy per Almyra Free.

## 2014-04-12 NOTE — Telephone Encounter (Signed)
Paper request from [place] received for alprazolam. Patient last office visit was 01/25/14. Medication has not been filled by Dr. Gilford Rile. Please advise.

## 2014-04-22 ENCOUNTER — Ambulatory Visit: Payer: Self-pay | Admitting: Nephrology

## 2014-04-26 ENCOUNTER — Ambulatory Visit: Payer: PRIVATE HEALTH INSURANCE | Admitting: Internal Medicine

## 2014-06-03 ENCOUNTER — Ambulatory Visit (INDEPENDENT_AMBULATORY_CARE_PROVIDER_SITE_OTHER): Payer: PRIVATE HEALTH INSURANCE | Admitting: Internal Medicine

## 2014-06-03 ENCOUNTER — Encounter: Payer: Self-pay | Admitting: Internal Medicine

## 2014-06-03 VITALS — BP 103/69 | HR 95 | Temp 98.4°F | Ht 64.0 in

## 2014-06-03 DIAGNOSIS — E559 Vitamin D deficiency, unspecified: Secondary | ICD-10-CM | POA: Insufficient documentation

## 2014-06-03 DIAGNOSIS — I10 Essential (primary) hypertension: Secondary | ICD-10-CM | POA: Diagnosis not present

## 2014-06-03 DIAGNOSIS — N183 Chronic kidney disease, stage 3 unspecified: Secondary | ICD-10-CM

## 2014-06-03 DIAGNOSIS — E119 Type 2 diabetes mellitus without complications: Secondary | ICD-10-CM

## 2014-06-03 LAB — COMPREHENSIVE METABOLIC PANEL
ALK PHOS: 77 U/L (ref 39–117)
ALT: 26 U/L (ref 0–35)
AST: 29 U/L (ref 0–37)
Albumin: 3.9 g/dL (ref 3.5–5.2)
BUN: 24 mg/dL — AB (ref 6–23)
CO2: 31 mEq/L (ref 19–32)
Calcium: 9.8 mg/dL (ref 8.4–10.5)
Chloride: 104 mEq/L (ref 96–112)
Creatinine, Ser: 1.46 mg/dL — ABNORMAL HIGH (ref 0.40–1.20)
GFR: 47 mL/min — ABNORMAL LOW (ref >60.00)
Glucose, Bld: 116 mg/dL — ABNORMAL HIGH (ref 70–99)
POTASSIUM: 3.8 meq/L (ref 3.5–5.1)
SODIUM: 139 meq/L (ref 135–145)
TOTAL PROTEIN: 6.9 g/dL (ref 6.0–8.3)
Total Bilirubin: 0.4 mg/dL (ref 0.2–1.2)

## 2014-06-03 LAB — VITAMIN D 25 HYDROXY (VIT D DEFICIENCY, FRACTURES): VITD: 38.54 ng/mL (ref 30.00–100.00)

## 2014-06-03 LAB — HEMOGLOBIN A1C: Hgb A1c MFr Bld: 6 % (ref 4.6–6.5)

## 2014-06-03 NOTE — Patient Instructions (Addendum)
Labs today.  Consider reading the book Always Hungry by Isabella Stalling.  Follow up in 3 months.

## 2014-06-03 NOTE — Assessment & Plan Note (Signed)
BP Readings from Last 3 Encounters:  06/03/14 103/69  01/25/14 135/83  09/23/13 120/66   BP well controlled. Renal function with labs. Continue Irbesartan-HCTZ.

## 2014-06-03 NOTE — Assessment & Plan Note (Signed)
Wt Readings from Last 3 Encounters:  01/25/14 249 lb 4 oz (113.059 kg)  09/23/13 248 lb 12 oz (112.832 kg)  08/17/13 246 lb 12 oz (111.925 kg)   There is no weight on file to calculate BMI. Pt refused to weigh today. Encouraged healthy diet, low in processed carbohydrates and exercise.

## 2014-06-03 NOTE — Assessment & Plan Note (Signed)
Will check A1c with labs. Encouraged healthy diet and exercise.

## 2014-06-03 NOTE — Assessment & Plan Note (Signed)
Repeat Vit D today 

## 2014-06-03 NOTE — Progress Notes (Signed)
Pre visit review using our clinic review tool, if applicable. No additional management support is needed unless otherwise documented below in the visit note. 

## 2014-06-03 NOTE — Assessment & Plan Note (Signed)
Will request renal US report. Reviewed notes from nephrology. Recheck renal function with labs today.

## 2014-06-03 NOTE — Progress Notes (Signed)
Subjective:    Patient ID: Lindsey Caldwell, female    DOB: [birth year], 59 y.o.   MRN: [id number]  HPI  59YO female presents for follow up.  DM - BG running near 100 fasting. Max near 125. No BG over 200. Following a healthy diet with some others at work. Trying to lose weight.  CKD - Seen by nephrology. Had renal US. No changes made to meds  HTN - BP had been elevated at nephrology, but better controlled at home. No CP, headache.  Past medical, surgical, family and social history per today's encounter.  Review of Systems  Constitutional: Negative for fever, chills, appetite change, fatigue and unexpected weight change.  Eyes: Negative for visual disturbance.  Respiratory: Negative for shortness of breath.   Cardiovascular: Negative for chest pain and leg swelling.  Gastrointestinal: Negative for nausea, vomiting, abdominal pain, diarrhea and constipation.  Musculoskeletal: Negative for myalgias and arthralgias.  Skin: Negative for color change and rash.  Hematological: Negative for adenopathy. Does not bruise/bleed easily.  Psychiatric/Behavioral: Negative for sleep disturbance and dysphoric mood. The patient is not nervous/anxious.        Objective:    BP 103/69 mmHg  Pulse 95  Temp(Src) 98.4 F (36.9 C) (Oral)  Ht 5\' 4"  (1.626 m)  Wt   SpO2 96% Physical Exam  Constitutional: She is oriented to person, place, and time. She appears well-developed and well-nourished. No distress.  HENT:  Head: Normocephalic and atraumatic.  Right Ear: External ear normal.  Left Ear: External ear normal.  Nose: Nose normal.  Mouth/Throat: Oropharynx is clear and moist. No oropharyngeal exudate.  Eyes: Conjunctivae are normal. Pupils are equal, round, and reactive to light. Right eye exhibits no discharge. Left eye exhibits no discharge. No scleral icterus.  Neck: Normal range of motion. Neck supple. No tracheal deviation present. No thyromegaly present.  Cardiovascular: Normal rate,  regular rhythm, normal heart sounds and intact distal pulses.  Exam reveals no gallop and no friction rub.   No murmur heard. Pulmonary/Chest: Effort normal and breath sounds normal. No respiratory distress. She has no wheezes. She has no rales. She exhibits no tenderness.  Musculoskeletal: Normal range of motion. She exhibits no edema or tenderness.  Lymphadenopathy:    She has no cervical adenopathy.  Neurological: She is alert and oriented to person, place, and time. No cranial nerve deficit. She exhibits normal muscle tone. Coordination normal.  Skin: Skin is warm and dry. No rash noted. She is not diaphoretic. No erythema. No pallor.  Psychiatric: She has a normal mood and affect. Her behavior is normal. Judgment and thought content normal.          Assessment & Plan:   Problem List Items Addressed This Visit      Unprioritized   Chronic kidney disease (CKD), stage III (moderate)    Will request renal US report. Reviewed notes from nephrology. Recheck renal function with labs today.      Diabetes type 2, controlled - Primary    Will check A1c with labs. Encouraged healthy diet and exercise.      Relevant Orders   Comprehensive metabolic panel   Hemoglobin A1c   Hypertension    BP Readings from Last 3 Encounters:  06/03/14 103/69  01/25/14 135/83  09/23/13 120/66   BP well controlled. Renal function with labs. Continue Irbesartan-HCTZ.      Morbid obesity    Wt Readings from Last 3 Encounters:  01/25/14 249 lb 4 oz (113.059 kg)  09/23/13  248 lb 12 oz (112.832 kg)  08/17/13 246 lb 12 oz (111.925 kg)   There is no weight on file to calculate BMI. Pt refused to weigh today. Encouraged healthy diet, low in processed carbohydrates and exercise.      Vitamin D deficiency    Repeat Vit D today.      Relevant Orders   Vit D  25 hydroxy (rtn osteoporosis monitoring)       Return in about 3 months (around 09/02/2014) for Recheck of Diabetes.

## 2014-06-07 ENCOUNTER — Other Ambulatory Visit: Payer: Self-pay | Admitting: *Deleted

## 2014-06-07 MED ORDER — IRBESARTAN 150 MG PO TABS: 150.0000 mg | ORAL_TABLET | Freq: Every day | ORAL | Status: DC

## 2014-06-07 NOTE — Telephone Encounter (Signed)
Refill request from [facility], filled by previous provider. Directions state once monthly Vitamin D 50,000. LAst Vitamin D 06/03/14. Ok refill and which directions?

## 2014-06-11 NOTE — Op Note (Signed)
PATIENT NAME:  Lindsey Caldwell, Lindsey Caldwell MR#:  [id number] DATE OF BIRTH:  1955/05/20  DATE OF PROCEDURE:  08/01/2012  PREOPERATIVE DIAGNOSIS: Internal derangement of the right knee.   POSTOPERATIVE DIAGNOSES: 1.  Tear of the posterior horn medial meniscus, right knee.  2.  Tear of the posterior horn lateral meniscus, right knee.  3.  Two loose bodies involving the intercondylar notch and medial gutter.   PROCEDURE PERFORMED: Right knee arthroscopy, partial medial and lateral meniscectomies and removal of 2 loose bodies.   SURGEON: Laurice Record. Holley Bouche., MD  ANESTHESIA: General.   ESTIMATED BLOOD LOSS: Minimal.   FLUIDS REPLACED: 1100 mL of crystalloid.   DRAINS: None.   INDICATIONS FOR SURGERY: The patient is a 59 year old female who has been seen for complaints of right knee pain and swelling. MRI demonstrated findings consistent with meniscal pathology. After discussion of the risks and benefits of surgical intervention, the patient expressed understanding of the risks and benefits and agreed with plans for surgical intervention.   PROCEDURE IN DETAIL: The patient was brought into the Operating Room and after adequate general anesthesia was achieved, a tourniquet was placed on the patient's right thigh and the leg was placed in a leg holder. All bony prominences were well padded. The patient's right knee and leg were cleaned and prepped with alcohol and DuraPrep and draped in the usual sterile fashion. A "timeout" was performed as per usual protocol. The anticipated portal sites were injected with 0.25% Marcaine with epinephrine. An anterolateral portal was created and a cannula was inserted. A large effusion was evacuated. The scope was inserted and the knee was distended with fluid using the Stryker pump. The scope was advanced down the medial gutter. A well-delineated loose body was encountered the medial gutter. An anteromedial portal was created, and a pituitary rongeur was advanced into the medial  gutter and the loose body was removed. Tissue was firm to the touch, suggestive of a more fibrotic lesion. The scope was then advanced into the medial compartment. There was a radial tear involving the posterior horn of the medial meniscus. This was debrided using meniscal punches and a 4.5 mm shaver. Contouring was performed using a 50 degree ArthroCare wand. The remaining rim of meniscus was visualized and probed and felt to be stable. The articular surface was in good condition. The scope was then advanced into the intercondylar region. A second loose body was encountered within the intercondylar notch. This was mobilized and then removed using the pituitary forceps. This loose body was also relatively fibrotic in nature. The anterior cruciate ligament was visualized and probed and felt to be stable. The scope was removed from the anterolateral portal and reinserted via the anteromedial portal so as to better visualize the lateral compartment. Inspection of the lateral compartment showed the articular surface to be in good condition. There was a small tear of the posterior horn of the lateral meniscus. The lateral meniscus tear was debrided using meniscal punches and a 4.5 mm shaver. Some fraying was also noted along the inner margin of the lateral meniscus along its lateral extent, and these areas were contoured and debrided using the ArthroCare wand. The remaining rim of meniscus was visualized and probed and felt to be stable. Finally, the scope was positioned so as to visualize the patellofemoral articulation. Some mild chondromalacia was noted to the patella. Good patellar tracking was appreciated.   The knee was irrigated with copious amounts of fluid and then suctioned dry. The anterolateral  portal was  reapproximated using 3-0 nylon. A combination of 0.25% Marcaine with epinephrine and 4 mg of morphine was injected via the scope. The scope was removed and the anteromedial portal was reapproximated using  3-0 nylon. A sterile dressing was applied, followed by application of an ice wrap.   The patient tolerated the procedure well. She was transported to the recovery room in stable condition.    ____________________________ Laurice Record. Holley Bouche., MD [email or web address]:jm D: 08/02/2012 06:04:53 ET T: 08/02/2012 14:19:57 ET JOB#: [id number]  cc: Jeneen Rinks P. Holley Bouche., MD, <Dictator> JAMES P Holley Bouche MD ELECTRONICALLY SIGNED 08/04/2012 6:54

## 2014-06-15 LAB — HEPATIC FUNCTION PANEL
ALT: 21 U/L (ref 7–35)
AST: 23 U/L (ref 13–35)
Alkaline Phosphatase: 86 U/L (ref 25–125)
Bilirubin, Total: 0.4 mg/dL

## 2014-06-15 LAB — BASIC METABOLIC PANEL
BUN: 19 mg/dL (ref 4–21)
Creatinine: 1.2 mg/dL — AB (ref 0.5–1.1)
Glucose: 106 mg/dL
Potassium: 3.9 mmol/L (ref 3.4–5.3)
Sodium: 144 mmol/L (ref 137–147)

## 2014-06-16 ENCOUNTER — Telehealth: Payer: Self-pay | Admitting: *Deleted

## 2014-06-16 NOTE — Telephone Encounter (Signed)
Pt called asking about Vitamin D refill, [facility] hadn't heard back from last week's request. Advised pt that Dr. Gilford Rile had denied it, that she could start taking Vitamin D3 OTC 2000 units daily.  verbalized understanding

## 2014-06-21 ENCOUNTER — Telehealth: Payer: Self-pay | Admitting: Internal Medicine

## 2014-06-21 ENCOUNTER — Encounter: Payer: Self-pay | Admitting: Internal Medicine

## 2014-06-21 ENCOUNTER — Ambulatory Visit (INDEPENDENT_AMBULATORY_CARE_PROVIDER_SITE_OTHER): Payer: PRIVATE HEALTH INSURANCE | Admitting: Internal Medicine

## 2014-06-21 VITALS — BP 150/100 | HR 87 | Temp 98.6°F | Resp 16 | Ht 64.0 in | Wt 248.8 lb

## 2014-06-21 DIAGNOSIS — M545 Low back pain, unspecified: Secondary | ICD-10-CM

## 2014-06-21 DIAGNOSIS — I10 Essential (primary) hypertension: Secondary | ICD-10-CM

## 2014-06-21 DIAGNOSIS — R319 Hematuria, unspecified: Secondary | ICD-10-CM

## 2014-06-21 DIAGNOSIS — R358 Other polyuria: Secondary | ICD-10-CM

## 2014-06-21 DIAGNOSIS — R350 Frequency of micturition: Secondary | ICD-10-CM | POA: Diagnosis not present

## 2014-06-21 DIAGNOSIS — N183 Chronic kidney disease, stage 3 unspecified: Secondary | ICD-10-CM

## 2014-06-21 DIAGNOSIS — R3589 Other polyuria: Secondary | ICD-10-CM

## 2014-06-21 DIAGNOSIS — R300 Dysuria: Secondary | ICD-10-CM

## 2014-06-21 LAB — POCT URINALYSIS DIPSTICK
Bilirubin, UA: NEGATIVE
Glucose, UA: NEGATIVE
Ketones, UA: NEGATIVE
LEUKOCYTES UA: NEGATIVE
NITRITE UA: NEGATIVE
Spec Grav, UA: 1.025
UROBILINOGEN UA: 0.2
pH, UA: 7.5

## 2014-06-21 MED ORDER — AMLODIPINE BESYLATE 5 MG PO TABS: 5.0000 mg | ORAL_TABLET | Freq: Every day | ORAL | Status: DC

## 2014-06-21 MED ORDER — OXYBUTYNIN CHLORIDE ER 5 MG PO TB24
5.0000 mg | ORAL_TABLET | Freq: Every day | ORAL | Status: DC
Start: 2014-06-21 — End: 2014-07-08

## 2014-06-21 NOTE — Telephone Encounter (Signed)
FYI Pt scheduled with you at 6:15pm

## 2014-06-21 NOTE — Addendum Note (Signed)
Addended by: Crecencio Mc on: 06/21/2014 02:16 PM   Modules accepted: Orders

## 2014-06-21 NOTE — Telephone Encounter (Signed)
URINE TESTS ORDEED.  PLEASE OBTAIN ASAP ARRIVAL

## 2014-06-21 NOTE — Progress Notes (Signed)
Patient ID: Lindsey Caldwell, female   DOB: 1956/02/07, 59 y.o.   MRN: [id number]   Patient Active Problem List   Diagnosis Date Noted  . Hematuria 06/23/2014  . Polyuria 06/23/2014  . Vitamin D deficiency 06/03/2014  . Routine general medical examination at a health care facility 01/25/2014  . Snoring 01/25/2014  . Chronic kidney disease (CKD), stage III (moderate) 09/23/2013  . Diabetes type 2, controlled 08/17/2013  . Insomnia 08/17/2013  . Hypertension 08/17/2013  . Gastroesophageal reflux disease without esophagitis 08/17/2013  . Morbid obesity 08/17/2013  . Left-sided low back pain without sciatica 08/17/2013    Subjective:  CC:   Chief Complaint  Patient presents with  . Urinary Tract Infection    right lower back pain, increased frequency,     HPI:   Lindsey Caldwell is a 59 y.o. female who presents for   1) New onset URINARY FREQUENCY STARTED 3 DAYS AGO,   No dysuria,accompanied by recurrent headaches.  Urinating up to 4 times per hour, and has been incontinent twice  Which has never happended before.      2) RIGHT LOWER LOWER BACK PAIN WELL BELOW THE KIDNEY  ALSO SINCE Friday  Treated with tylenol at 6 PM  OR 7,  TAKING TYLENOL,  No recent heavy lifting.  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  Sedentary job and lifestyle . Marland Kitchen  DIPSTICK UA HERE TODAY IS NORMAL BUT HAD ABNORMAL ONE  LAST NIGHT   3) CKD:  Has seen Dr Kingsley Callander,  [facility] twice, by referral drom PCP for elevated Cr but  doesn't llike him,  "he doesn't answer my questions. i want to see a different nephrologist,  One in [place]."      Past Medical History  Diagnosis Date  . Diabetes mellitus without complication   . Hypertension   . Anxiety   . High cholesterol     Past Surgical History  Procedure Laterality Date  . Back surgery  07/12/10    Dr. Arnoldo Morale  . Knee surgery  08/01/12    . triamcinolone acetonide  10 mg Other Once     The following portions of the patient's  history were reviewed and updated as appropriate: Allergies, current medications, and problem list.    Review of Systems:   Patient denies headache, fevers, malaise, unintentional weight loss, skin rash, eye pain, sinus congestion and sinus pain, sore throat, dysphagia,  hemoptysis , cough, dyspnea, wheezing, chest pain, palpitations, orthopnea, edema, abdominal pain, nausea, melena, diarrhea, constipation, flank pain, dysuria, hematuria, urinary  Frequency, nocturia, numbness, tingling, seizures,  Focal weakness, Loss of consciousness,  Tremor, insomnia, depression, anxiety, and suicidal ideation.     History   Social History  . Marital Status: Married    Spouse Name: N/A  . Number of Children: N/A  . Years of Education: N/A   Occupational History  . Not on file.   Social History Main Topics  . Smoking status: Former Research scientist (life sciences)  . Smokeless tobacco: Never Used     Comment: quit 16 years ago   . Alcohol Use: No  . Drug Use: No  . Sexual Activity: Not on file   Other Topics Concern  . Not on file   Social History Narrative   Lives in [place] with husband and granddaughter. Has 2 sons. One lives in [place] and one in [place]. No pets.      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 ROM normal. No CVA tenderness. Lungs: clear to auscultation bilaterally Heart: regular rate and rhythm, S1, S2 normal, no murmur, click, rub or gallop Abdomen: soft, non-tender; bowel sounds normal; no masses,  no  organomegaly Pulses: 2+ and symmetric Skin: Skin color, texture, turgor normal. No rashes or lesions Lymph nodes: Cervical, supraclavicular, and axillary nodes normal.  Assessment and Plan:  Hypertension ADDING AMLODIPINE    Chronic kidney disease (CKD), stage III (moderate) Stable by repeat labs,  With no major change since stopping HCTZ.  Patient has had initial evaluation but requesting alternative nephrologist.  Referral to [facility] underway.     Left-sided low back pain without sciatica She has microscopic hematuria chronically, no history of stones.  Nephrology workup for hematuria underway.    Hematuria Dysmorphic RBCS reported by prior nephrology evaluation and workup for GN underway but not complete.  Referral to [facility] underway per patient request.    Polyuria neew onset,  With normal sodium levels arguing against  DI. No signs of UTI on UA,.  Trial of oxybutynin ordered 5 mg     Updated Medication List Outpatient Encounter Prescriptions as of 06/21/2014  Medication Sig  . ALPRAZolam (XANAX) 0.5 MG tablet Take 1 tablet (0.5 mg total) by mouth at bedtime as needed for anxiety.  . cyclobenzaprine (FLEXERIL) 10 MG tablet Take 10 mg by mouth 3 (three) times daily as needed for muscle spasms.  . DEXILANT 60 MG capsule TAKE ONE CAPSULE BY MOUTH DAILY  . irbesartan (AVAPRO) 150 MG tablet Take 1 tablet (150 mg total) by mouth daily.  Marland Kitchen lovastatin (ALTOPREV) 20 MG 24 hr tablet Take 1 tablet (20 mg total) by mouth at bedtime.  . montelukast (SINGULAIR) 10 MG tablet Take 1 tablet (10 mg total) by mouth at bedtime.  Marland Kitchen nystatin-triamcinolone ointment (MYCOLOG) Apply 1 application topically 2 (two) times daily.  Marland Kitchen amLODipine (NORVASC) 5 MG tablet Take 1 tablet (5 mg total) by mouth daily.  . Eszopiclone (ESZOPICLONE) 3 MG TABS Take 1 tablet (3 mg total) by mouth at bedtime. Take immediately before bedtime (Patient not taking: Reported on 06/21/2014)  . oxybutynin (DITROPAN-XL) 5  MG 24 hr tablet Take 1 tablet (5 mg total) by mouth at bedtime.  . Vitamin D, Ergocalciferol, (DRISDOL) 50000 UNITS CAPS capsule Take 50,000 Units by mouth every 7 (seven) days.   Facility-Administered Encounter Medications as of 06/21/2014  Medication  . triamcinolone acetonide (KENALOG) 10 MG/ML injection 10 mg     Orders Placed This Encounter  Procedures  . Urine Culture  . Urinalysis, Routine w reflex microscopic  . Comp Met (CMET)  . POCT Urinalysis Dipstick    No Follow-up on file.      A total of 40 minutes was spent with patient more than half of which was spent in counseling patient on the above mentioned issues , reviewing and explaining recent labs and imaging studies done, and coordination of care.

## 2014-06-21 NOTE — Telephone Encounter (Signed)
Patient Name: BIRDIE FETTY  DOB: Jul 16, 1955    Initial Comment caller states bp been high, been sic all weekend, urinating a lot, blood in urine   Nurse Assessment  Nurse: Leilani Merl, RN, Nira Conn Date/Time (Eastern Time): 06/21/2014 12:06:46 PM  Confirm and document reason for call. If symptomatic, describe symptoms. ---Caller states that she is urinating more than normal and she has blood in her urine. This started on Friday.  Has the patient traveled out of the country within the last 30 days? ---Not Applicable  Does the patient require triage? ---Yes  Related visit to physician within the last 2 weeks? ---No  Does the PT have any chronic conditions? (i.e. diabetes, asthma, etc.) ---Yes  List chronic conditions. ---DM     Guidelines    Guideline Title Affirmed Question Affirmed Notes  Urinary Symptoms [1] Can't control passage of urine (i.e., urinary incontinence) AND [2] new onset (< 2 weeks) or worsening    Final Disposition User   See Physician within 24 Hours Standifer, RN, Water quality scientist    Comments  Appt made with Dr. Derrel Nip today at 6:15 pm.

## 2014-06-21 NOTE — Assessment & Plan Note (Signed)
ADDING AMLODIPINE

## 2014-06-21 NOTE — Progress Notes (Signed)
Pre-visit discussion using our clinic review tool. No additional management support is needed unless otherwise documented below in the visit note.  

## 2014-06-21 NOTE — Patient Instructions (Addendum)
Your urinalysis did not suggest infection  Today.   I  Am adding amlodipine for your uncontrolled blood pressure.  Start with 5 mg daily ,  Increase to 10 mg in one week if BP is not < 130/80.  I am recommending a Trial of oxybutynin 5 mg at bedtime for urinary frequency (this is generic for ditropan)  I am sending your urine for more testing since there are no obvious signs of infection  You can take up to 4 tylenol daily for your back pain   We will make a referral to a different nephrologist as you requested , in [place]

## 2014-06-21 NOTE — Telephone Encounter (Signed)
Orders placed at lunch and ready.

## 2014-06-22 LAB — URINALYSIS, ROUTINE W REFLEX MICROSCOPIC
Bilirubin Urine: NEGATIVE
KETONES UR: NEGATIVE
Leukocytes, UA: NEGATIVE
Nitrite: NEGATIVE
PH: 7.5 (ref 5.0–8.0)
SPECIFIC GRAVITY, URINE: 1.015 (ref 1.000–1.030)
Total Protein, Urine: NEGATIVE
UROBILINOGEN UA: 0.2 (ref 0.0–1.0)
Urine Glucose: NEGATIVE
WBC, UA: NONE SEEN (ref 0–?)

## 2014-06-22 LAB — COMPREHENSIVE METABOLIC PANEL
ALT: 15 U/L (ref 0–35)
AST: 17 U/L (ref 0–37)
Albumin: 3.7 g/dL (ref 3.5–5.2)
Alkaline Phosphatase: 79 U/L (ref 39–117)
BUN: 18 mg/dL (ref 6–23)
CALCIUM: 9.4 mg/dL (ref 8.4–10.5)
CO2: 30 meq/L (ref 19–32)
CREATININE: 1.43 mg/dL — AB (ref 0.40–1.20)
Chloride: 104 mEq/L (ref 96–112)
GFR: 48.13 mL/min — AB (ref >60.00)
Glucose, Bld: 86 mg/dL (ref 70–99)
Potassium: 4 mEq/L (ref 3.5–5.1)
Sodium: 139 mEq/L (ref 135–145)
Total Bilirubin: 0.5 mg/dL (ref 0.2–1.2)
Total Protein: 6.6 g/dL (ref 6.0–8.3)

## 2014-06-23 ENCOUNTER — Other Ambulatory Visit: Payer: Self-pay

## 2014-06-23 ENCOUNTER — Encounter: Payer: Self-pay | Admitting: Emergency Medicine

## 2014-06-23 ENCOUNTER — Emergency Department
Admission: EM | Admit: 2014-06-23 | Discharge: 2014-06-23 | Disposition: A | Discharge status: Home / Self Care | Payer: PRIVATE HEALTH INSURANCE | Attending: Emergency Medicine | Admitting: Emergency Medicine

## 2014-06-23 DIAGNOSIS — I10 Essential (primary) hypertension: Secondary | ICD-10-CM | POA: Insufficient documentation

## 2014-06-23 DIAGNOSIS — R3589 Other polyuria: Secondary | ICD-10-CM | POA: Insufficient documentation

## 2014-06-23 DIAGNOSIS — Z79899 Other long term (current) drug therapy: Secondary | ICD-10-CM | POA: Diagnosis not present

## 2014-06-23 DIAGNOSIS — Z87891 Personal history of nicotine dependence: Secondary | ICD-10-CM | POA: Diagnosis not present

## 2014-06-23 DIAGNOSIS — R319 Hematuria, unspecified: Secondary | ICD-10-CM | POA: Insufficient documentation

## 2014-06-23 DIAGNOSIS — E119 Type 2 diabetes mellitus without complications: Secondary | ICD-10-CM | POA: Diagnosis not present

## 2014-06-23 DIAGNOSIS — R358 Other polyuria: Secondary | ICD-10-CM | POA: Insufficient documentation

## 2014-06-23 HISTORY — DX: Gastro-esophageal reflux disease without esophagitis: K21.9

## 2014-06-23 HISTORY — DX: Other lipid storage disorders: E75.5

## 2014-06-23 LAB — COMPREHENSIVE METABOLIC PANEL
ALT: 17 U/L (ref 14–54)
AST: 25 U/L (ref 15–41)
Albumin: 3.7 g/dL (ref 3.5–5.0)
Alkaline Phosphatase: 81 U/L (ref 38–126)
Anion gap: 10 (ref 5–15)
BILIRUBIN TOTAL: 0.6 mg/dL (ref 0.3–1.2)
BUN: 17 mg/dL (ref 6–20)
CHLORIDE: 106 mmol/L (ref 101–111)
CO2: 23 mmol/L (ref 22–32)
CREATININE: 1.18 mg/dL — AB (ref 0.44–1.00)
Calcium: 9.5 mg/dL (ref 8.9–10.3)
GFR calc Af Amer: 57 mL/min — ABNORMAL LOW (ref >60)
GFR, EST NON AFRICAN AMERICAN: 49 mL/min — AB (ref >60)
GLUCOSE: 128 mg/dL — AB (ref 65–99)
Potassium: 3.6 mmol/L (ref 3.5–5.1)
Sodium: 139 mmol/L (ref 135–145)
Total Protein: 6.8 g/dL (ref 6.5–8.1)

## 2014-06-23 LAB — CBC
HCT: 37.4 % (ref 35.0–47.0)
Hemoglobin: 11.9 g/dL — ABNORMAL LOW (ref 12.0–16.0)
MCH: 27.9 pg (ref 26.0–34.0)
MCHC: 31.7 g/dL — ABNORMAL LOW (ref 32.0–36.0)
MCV: 87.9 fL (ref 80.0–100.0)
PLATELETS: 236 10*3/uL (ref 150–440)
RBC: 4.26 MIL/uL (ref 3.80–5.20)
RDW: 13.6 % (ref 11.5–14.5)
WBC: 11.2 10*3/uL — AB (ref 3.6–11.0)

## 2014-06-23 LAB — URINALYSIS COMPLETE WITH MICROSCOPIC (ARMC ONLY)
Bacteria, UA: NONE SEEN
Bilirubin Urine: NEGATIVE
Glucose, UA: NEGATIVE mg/dL
KETONES UR: NEGATIVE mg/dL
Leukocytes, UA: NEGATIVE
Nitrite: NEGATIVE
PH: 5 (ref 5.0–8.0)
Protein, ur: NEGATIVE mg/dL
Specific Gravity, Urine: 1.019 (ref 1.005–1.030)

## 2014-06-23 LAB — TROPONIN I: Troponin I: <0.03 ng/mL (ref <0.031)

## 2014-06-23 MED ORDER — ACETAMINOPHEN 325 MG PO TABS
650.0000 mg | ORAL_TABLET | Freq: Once | ORAL | Status: AC
Start: 2014-06-23 — End: 2014-06-23
  Administered 2014-06-23: 650 mg via ORAL

## 2014-06-23 NOTE — Assessment & Plan Note (Signed)
Stable by repeat labs,  With no major change since stopping HCTZ.  Patient has had initial evaluation but requesting alternative nephrologist.  Referral to [facility] underway.

## 2014-06-23 NOTE — Assessment & Plan Note (Signed)
neew onset,  With normal sodium levels arguing against  DI. No signs of UTI on UA,.  Trial of oxybutynin ordered 5 mg

## 2014-06-23 NOTE — ED Provider Notes (Addendum)
[facility] Emergency Department Provider Note    Time seen: 8:49 PM  I have reviewed the triage vital signs and the nursing notes.   HISTORY  Chief Complaint Hypertension    HPI Lindsey Caldwell is a 59 y.o. female is here for high blood pressure and palpitations since Friday. States she saw her primary care doctor on Monday and is new blood pressure medication. Patient states she is not feeling any better today and her blood pressure and elevated to 160/110. She does have a history of hypertension. She notes that she was on HCTZ in the past and it caused some renal insufficiency so they took her off of it according to what she told me. Describes heart palpitations but they resolved now she thinks he may they may be anxiety. Symptoms are mild currently occurred during work today nothing made it better or worse.     Past Medical History  Diagnosis Date  . Diabetes mellitus without complication   . Hypertension   . Anxiety   . High cholesterol     Patient Active Problem List   Diagnosis Date Noted  . Hematuria 06/23/2014  . Polyuria 06/23/2014  . Vitamin D deficiency 06/03/2014  . Routine general medical examination at a health care facility 01/25/2014  . Snoring 01/25/2014  . Chronic kidney disease (CKD), stage III (moderate) 09/23/2013  . Diabetes type 2, controlled 08/17/2013  . Insomnia 08/17/2013  . Hypertension 08/17/2013  . Gastroesophageal reflux disease without esophagitis 08/17/2013  . Morbid obesity 08/17/2013  . Left-sided low back pain without sciatica 08/17/2013    Past Surgical History  Procedure Laterality Date  . Back surgery  07/12/10    Dr. Arnoldo Morale  . Knee surgery  08/01/12    Current Outpatient Rx  Name  Route  Sig  Dispense  Refill  . ALPRAZolam (XANAX) 0.5 MG tablet   Oral   Take 1 tablet (0.5 mg total) by mouth at bedtime as needed for anxiety.   30 tablet   3   . amLODipine (NORVASC) 5 MG tablet   Oral   Take 1  tablet (5 mg total) by mouth daily.   90 tablet   3   . cyclobenzaprine (FLEXERIL) 10 MG tablet   Oral   Take 10 mg by mouth 3 (three) times daily as needed for muscle spasms.         . DEXILANT 60 MG capsule      TAKE ONE CAPSULE BY MOUTH DAILY   30 capsule   5   . Eszopiclone (ESZOPICLONE) 3 MG TABS   Oral   Take 1 tablet (3 mg total) by mouth at bedtime. Take immediately before bedtime Patient not taking: Reported on 06/21/2014   30 tablet   1   . irbesartan (AVAPRO) 150 MG tablet   Oral   Take 1 tablet (150 mg total) by mouth daily.   30 tablet   2   . lovastatin (ALTOPREV) 20 MG 24 hr tablet   Oral   Take 1 tablet (20 mg total) by mouth at bedtime.   30 tablet   5   . montelukast (SINGULAIR) 10 MG tablet   Oral   Take 1 tablet (10 mg total) by mouth at bedtime.   30 tablet   5   . nystatin-triamcinolone ointment (MYCOLOG)   Topical   Apply 1 application topically 2 (two) times daily.   30 g   0   . oxybutynin (DITROPAN-XL) 5 MG 24  hr tablet   Oral   Take 1 tablet (5 mg total) by mouth at bedtime.   30 tablet   1   . Vitamin D, Ergocalciferol, (DRISDOL) 50000 UNITS CAPS capsule   Oral   Take 50,000 Units by mouth every 7 (seven) days.           Allergies Gadolinium derivatives; Codeine; and Percocet  Family History  Problem Relation Age of Onset  . Cancer Father     Hodgkins  . Diabetes Sister   . Cancer Sister     brain tumor  . Cancer Sister     breast    Social History History  Substance Use Topics  . Smoking status: Former Research scientist (life sciences)  . Smokeless tobacco: Never Used     Comment: quit 16 years ago   . Alcohol Use: No    Review of Systems Constitutional: Negative for fever. Eyes: Negative for visual changes. ENT: Negative for sore throat. Cardiovascular: Negative for chest pain, positive for palpitations Respiratory: Negative for shortness of breath. Gastrointestinal: Negative for abdominal pain, vomiting and  diarrhea. Genitourinary: Negative for dysuria. Musculoskeletal: Negative for back pain. Skin: Negative for rash. Neurological: Negative for headaches, focal weakness or numbness.  10-point ROS otherwise negative.  ____________________________________________   PHYSICAL EXAM:  VITAL SIGNS: ED Triage Vitals  Enc Vitals Group     BP 06/23/14 2014 148/82 mmHg     Pulse Rate 06/23/14 2014 91     Resp --      Temp 06/23/14 2014 98.1 F (36.7 C)     Temp Source 06/23/14 2014 Oral     SpO2 06/23/14 2013 96 %     Weight 06/23/14 2014 242 lb (109.77 kg)     Height 06/23/14 2014 5\' 4"  (1.626 m)     Head Cir --      Peak Flow --      Pain Score 06/23/14 2015 6     Pain Loc --      Pain Edu? --      Excl. in [place]? --     Constitutional: Alert and oriented. Well appearing and in no distress, obese Eyes: Conjunctivae are normal. PERRL. Normal extraocular movements. ENT   Head: Normocephalic and atraumatic.   Nose: No congestion/rhinnorhea.   Mouth/Throat: Mucous membranes are moist.   Neck: No stridor. Hematological/Lymphatic/Immunilogical: No cervical lymphadenopathy. Cardiovascular: Normal rate, regular rhythm. Normal and symmetric distal pulses are present in all extremities. No murmurs, rubs, or gallops. Respiratory: Normal respiratory effort without tachypnea nor retractions. Breath sounds are clear and equal bilaterally. No wheezes/rales/rhonchi. Gastrointestinal: Soft and nontender. No distention. No abdominal bruits. There is no CVA tenderness. Musculoskeletal: Nontender with normal range of motion in all extremities. No joint effusions.  No lower extremity tenderness nor edema. Neurologic:  Normal speech and language. No gross focal neurologic deficits are appreciated. Speech is normal. No gait instability. Skin:  Skin is warm, dry and intact. No rash noted. Psychiatric: Mood and affect are normal. Speech and behavior are normal. Patient exhibits appropriate insight  and judgment.  ____________________________________________    LABS (pertinent positives/negatives)  Grossly unremarkable.  ____________________________________________ EKG: Normal sinus rhythm with a rate of 95 possible septal Q waves normal axis normal intervals no evidence of hypertrophy. EKG was interpreted by me   RADIOLOGY  None  ____________________________________________    ED COURSE  Pertinent labs & imaging results that were available during my care of the patient were reviewed by me and considered in my medical decision  making (see chart for details).  Which basic labs. Patient is in no acute distress this time.  FINAL ASSESSMENT AND PLAN  Hypertension  Plan: We'll have her continue taking her blood pressure medicine as prescribed. Follow-up with her doctor in the next week for reevaluation while keeping a journal of her blood pressures.    Earleen Newport, MD   Earleen Newport, MD 06/23/14 [place], MD 07/08/14 [phone]

## 2014-06-23 NOTE — Discharge Instructions (Signed)

## 2014-06-23 NOTE — Assessment & Plan Note (Signed)
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  Referral to [facility] underway per patient request.

## 2014-06-23 NOTE — ED Notes (Signed)
Pt in with co high bp and palpitations since Friday saw pmd and they added bp med.  States still not better and bp today 160/110

## 2014-06-23 NOTE — Assessment & Plan Note (Addendum)
She has microscopic hematuria chronically, no history of stones.  Nephrology workup for hematuria underway.

## 2014-06-24 LAB — URINE CULTURE
COLONY COUNT: NO GROWTH
Organism ID, Bacteria: NO GROWTH

## 2014-07-07 ENCOUNTER — Telehealth: Payer: Self-pay

## 2014-07-07 NOTE — Telephone Encounter (Signed)
Patient called triage line and despite taking both BP meds (amlopdipine and irbesartan) she is having increased swelling.  Last OV was 06/21/14.  Her BP at visit was high 150/100.  Please advise?

## 2014-07-07 NOTE — Telephone Encounter (Signed)
Needs to be seen

## 2014-07-07 NOTE — Telephone Encounter (Signed)
Scheduled pt appt for tomorrow morning

## 2014-07-07 NOTE — Telephone Encounter (Signed)
The patient called and stated she has started swelling despite being on two different blood pressure pills  Callback [phone]

## 2014-07-08 ENCOUNTER — Ambulatory Visit (INDEPENDENT_AMBULATORY_CARE_PROVIDER_SITE_OTHER): Payer: PRIVATE HEALTH INSURANCE | Admitting: Internal Medicine

## 2014-07-08 ENCOUNTER — Encounter: Payer: Self-pay | Admitting: Internal Medicine

## 2014-07-08 VITALS — BP 123/81 | HR 93 | Temp 98.1°F | Ht 64.0 in | Wt 249.1 lb

## 2014-07-08 DIAGNOSIS — N183 Chronic kidney disease, stage 3 unspecified: Secondary | ICD-10-CM

## 2014-07-08 DIAGNOSIS — I10 Essential (primary) hypertension: Secondary | ICD-10-CM | POA: Diagnosis not present

## 2014-07-08 DIAGNOSIS — R609 Edema, unspecified: Secondary | ICD-10-CM | POA: Diagnosis not present

## 2014-07-08 LAB — COMPREHENSIVE METABOLIC PANEL
ALT: 21 U/L (ref 0–35)
AST: 23 U/L (ref 0–37)
Albumin: 3.9 g/dL (ref 3.5–5.2)
Alkaline Phosphatase: 74 U/L (ref 39–117)
BUN: 21 mg/dL (ref 6–23)
CO2: 30 mEq/L (ref 19–32)
Calcium: 9.8 mg/dL (ref 8.4–10.5)
Chloride: 103 mEq/L (ref 96–112)
Creatinine, Ser: 1.13 mg/dL (ref 0.40–1.20)
GFR: 63.15 mL/min (ref >60.00)
Glucose, Bld: 103 mg/dL — ABNORMAL HIGH (ref 70–99)
Potassium: 3.5 mEq/L (ref 3.5–5.1)
Sodium: 139 mEq/L (ref 135–145)
Total Bilirubin: 0.5 mg/dL (ref 0.2–1.2)
Total Protein: 6.7 g/dL (ref 6.0–8.3)

## 2014-07-08 NOTE — Patient Instructions (Signed)
Consider reading the book "Always Hungry" by Dr. Isabella Stalling.  Start using compression stockings, Mediven.  Keep legs elevated at night and limit sodium intake.  Labs today.

## 2014-07-08 NOTE — Assessment & Plan Note (Signed)
BP Readings from Last 3 Encounters:  07/08/14 123/81  06/23/14 159/90  06/21/14 150/100   BP improved today. Will continue current medications. Discussed potentially adding a diuretic prn if symptoms of LEE persistent. Follow up in 4 weeks.

## 2014-07-08 NOTE — Progress Notes (Signed)
Subjective:    Patient ID: Lindsey Caldwell, female    DOB: [birth year], 59 y.o.   MRN: [id number]  HPI  59YO female presents for acute visit.  Edema - Bilateral lower extremity edema noted by pt, mostly at night after work. Improved in the mornings. No longer taking HCTZ.  Frustrated by inability to lose weight. Exercising at [employer] several days per week. Tries to follow a healthy diet.   Wt Readings from Last 3 Encounters:  07/08/14 249 lb 2 oz (113.002 kg)  06/23/14 242 lb (109.77 kg)  06/21/14 248 lb 12 oz (112.832 kg)    Past medical, surgical, family and social history per today's encounter.  Review of Systems  Constitutional: Negative for fever, chills, appetite change, fatigue and unexpected weight change.  Eyes: Negative for visual disturbance.  Respiratory: Negative for shortness of breath.   Cardiovascular: Positive for leg swelling. Negative for chest pain and palpitations.  Gastrointestinal: Negative for abdominal pain.  Skin: Negative for color change and rash.  Hematological: Negative for adenopathy. Does not bruise/bleed easily.  Psychiatric/Behavioral: Negative for dysphoric mood. The patient is not nervous/anxious.        Objective:    BP 123/81 mmHg  Pulse 93  Temp(Src) 98.1 F (36.7 C) (Oral)  Ht 5\' 4"  (1.626 m)  Wt 249 lb 2 oz (113.002 kg)  BMI 42.74 kg/m2  SpO2 96% Physical Exam  Constitutional: She is oriented to person, place, and time. She appears well-developed and well-nourished. No distress.  HENT:  Head: Normocephalic and atraumatic.  Right Ear: External ear normal.  Left Ear: External ear normal.  Nose: Nose normal.  Mouth/Throat: Oropharynx is clear and moist.  Eyes: Conjunctivae are normal. Pupils are equal, round, and reactive to light. Right eye exhibits no discharge. Left eye exhibits no discharge. No scleral icterus.  Neck: Normal range of motion. Neck supple. No tracheal deviation present. No thyromegaly present.    Cardiovascular: Normal rate, regular rhythm, normal heart sounds and intact distal pulses.  Exam reveals no gallop and no friction rub.   No murmur heard. Pulmonary/Chest: Effort normal and breath sounds normal. No respiratory distress. She has no wheezes. She has no rales. She exhibits no tenderness.  Musculoskeletal: Normal range of motion. She exhibits no edema or tenderness.  Lymphadenopathy:    She has no cervical adenopathy.  Neurological: She is alert and oriented to person, place, and time. No cranial nerve deficit. She exhibits normal muscle tone. Coordination normal.  Skin: Skin is warm and dry. No rash noted. She is not diaphoretic. No erythema. No pallor.  Psychiatric: She has a normal mood and affect. Her behavior is normal. Judgment and thought content normal.          Assessment & Plan:   Problem List Items Addressed This Visit      Unprioritized   Chronic kidney disease (CKD), stage III (moderate)    Reviewed recent renal function which showed modest improvement. Will repeat renal function today given recent increase in LEE.      Edema    Bilateral lower extremity edema at night. No edema noted on exam this morning. Encouraged her to start using Compression stockings and to keep legs elevated at night. Encouraged her to limit salt intake. Discussed risks of diuretics.      Hypertension - Primary    BP Readings from Last 3 Encounters:  07/08/14 123/81  06/23/14 159/90  06/21/14 150/100   BP improved today. Will continue current medications. Discussed potentially  adding a diuretic prn if symptoms of LEE persistent. Follow up in 4 weeks.      Relevant Orders   Comprehensive metabolic panel       Return in about 4 weeks (around 08/05/2014) for Recheck.

## 2014-07-08 NOTE — Assessment & Plan Note (Signed)
Reviewed recent renal function which showed modest improvement. Will repeat renal function today given recent increase in LEE.

## 2014-07-08 NOTE — Progress Notes (Signed)
Pre visit review using our clinic review tool, if applicable. No additional management support is needed unless otherwise documented below in the visit note. 

## 2014-07-08 NOTE — Assessment & Plan Note (Signed)
Bilateral lower extremity edema at night. No edema noted on exam this morning. Encouraged her to start using Compression stockings and to keep legs elevated at night. Encouraged her to limit salt intake. Discussed risks of diuretics.

## 2014-08-11 ENCOUNTER — Encounter: Payer: Self-pay | Admitting: *Deleted

## 2014-08-16 ENCOUNTER — Ambulatory Visit: Payer: PRIVATE HEALTH INSURANCE | Admitting: Internal Medicine

## 2014-08-30 ENCOUNTER — Telehealth: Payer: Self-pay | Admitting: Internal Medicine

## 2014-08-30 DIAGNOSIS — Z1239 Encounter for other screening for malignant neoplasm of breast: Secondary | ICD-10-CM

## 2014-08-30 NOTE — Telephone Encounter (Signed)
Please add Mammogram order

## 2014-08-30 NOTE — Telephone Encounter (Signed)
Pt called to ask about scheduling a mammogram. No order in the system.msn

## 2014-09-22 ENCOUNTER — Other Ambulatory Visit: Payer: Self-pay | Admitting: Internal Medicine

## 2014-09-28 ENCOUNTER — Ambulatory Visit
Admission: RE | Admit: 2014-09-28 | Discharge: 2014-09-28 | Disposition: A | Discharge status: Home / Self Care | Payer: PRIVATE HEALTH INSURANCE | Source: Ambulatory Visit | Attending: Internal Medicine | Admitting: Internal Medicine

## 2014-09-28 DIAGNOSIS — Z1231 Encounter for screening mammogram for malignant neoplasm of breast: Secondary | ICD-10-CM | POA: Diagnosis present

## 2014-09-28 DIAGNOSIS — Z1239 Encounter for other screening for malignant neoplasm of breast: Secondary | ICD-10-CM

## 2014-09-28 LAB — HM MAMMOGRAPHY: HM Mammogram: NORMAL

## 2014-10-06 ENCOUNTER — Ambulatory Visit: Payer: PRIVATE HEALTH INSURANCE | Admitting: Internal Medicine

## 2014-10-21 ENCOUNTER — Other Ambulatory Visit: Payer: Self-pay | Admitting: Internal Medicine

## 2014-11-03 ENCOUNTER — Encounter: Payer: Self-pay | Admitting: Nurse Practitioner

## 2014-11-03 ENCOUNTER — Ambulatory Visit (INDEPENDENT_AMBULATORY_CARE_PROVIDER_SITE_OTHER): Payer: PRIVATE HEALTH INSURANCE | Admitting: Nurse Practitioner

## 2014-11-03 ENCOUNTER — Encounter (INDEPENDENT_AMBULATORY_CARE_PROVIDER_SITE_OTHER): Payer: Self-pay

## 2014-11-03 VITALS — BP 120/82 | HR 88 | Temp 98.4°F | Resp 16 | Ht 64.0 in | Wt 248.2 lb

## 2014-11-03 DIAGNOSIS — K625 Hemorrhage of anus and rectum: Secondary | ICD-10-CM | POA: Diagnosis not present

## 2014-11-03 DIAGNOSIS — K6289 Other specified diseases of anus and rectum: Secondary | ICD-10-CM | POA: Diagnosis not present

## 2014-11-03 LAB — IFOBT (OCCULT BLOOD): IFOBT: NEGATIVE

## 2014-11-03 NOTE — Progress Notes (Signed)
Patient ID: Lindsey Caldwell, female    DOB: [birth year]  Age: 59 y.o. MRN: [id number]  CC: Rectal Bleeding   HPI Lindsey Caldwell presents for rectal bleeding with exercise x 3 weeks.   1) Pt reports rectal bleeding with exertion intermittently x 3 weeks. Sees blood in back of underwear. She reports it is not from the vagina or urethra.  "One time was a lot", but usually just has a few spots of bright red  Even though it was a lot she reports that it did not go through underwear onto other clothing Feels irritated with bowel movements. BM's are firm, but not hard she reports.   Drinking water and eating apples for fiber Exercising 3-4 x a week   Reports it was not coming from vagina or with urine   Preparation H- helpful   Colonoscopy UTD in 2010  History Lindsey Caldwell has a past medical history of Diabetes mellitus without complication; Hypertension; Anxiety; High cholesterol; Cholestanol storage disease; and GERD (gastroesophageal reflux disease).   She has past surgical history that includes Back surgery (07/12/10) and Knee surgery (08/01/12).   Her family history includes Breast cancer (age of onset: 42) in her sister; Cancer in her father, sister, and sister; Diabetes in her sister.She reports that she has quit smoking. She has never used smokeless tobacco. She reports that she does not drink alcohol or use illicit drugs.  Outpatient Prescriptions Prior to Visit  Medication Sig Dispense Refill  . ALPRAZolam (XANAX) 0.5 MG tablet Take 1 tablet (0.5 mg total) by mouth at bedtime as needed for anxiety. 30 tablet 3  . amLODipine (NORVASC) 5 MG tablet Take 1 tablet (5 mg total) by mouth daily. 90 tablet 3  . cyclobenzaprine (FLEXERIL) 10 MG tablet Take 10 mg by mouth 3 (three) times daily as needed for muscle spasms.    . DEXILANT 60 MG capsule TAKE ONE CAPSULE BY MOUTH DAILY 30 capsule 5  . Eszopiclone (ESZOPICLONE) 3 MG TABS Take 1 tablet (3 mg total) by mouth at bedtime. Take immediately  before bedtime 30 tablet 1  . irbesartan (AVAPRO) 150 MG tablet TAKE ONE (1) TABLET BY MOUTH EVERY DAY 30 tablet 3  . lovastatin (MEVACOR) 20 MG tablet TAKE ONE TABLET BY MOUTH EVERY NIGHT AT BEDTIME 30 tablet 6  . montelukast (SINGULAIR) 10 MG tablet TAKE ONE TABLET BY MOUTH EVERY NIGHT AT BEDTIME 30 tablet 6  . nystatin-triamcinolone ointment (MYCOLOG) Apply 1 application topically 2 (two) times daily. 30 g 0   Facility-Administered Medications Prior to Visit  Medication Dose Route Frequency Provider Last Rate Last Dose  . triamcinolone acetonide (KENALOG) 10 MG/ML injection 10 mg  10 mg Other Once Wallene Huh, DPM        ROS Review of Systems  Constitutional: Negative for fever, chills, diaphoresis and fatigue.  Gastrointestinal: Positive for anal bleeding and rectal pain. Negative for nausea, vomiting, abdominal pain, diarrhea, constipation, blood in stool and abdominal distention.  Skin: Negative for rash.  Neurological: Negative for dizziness.    Objective:  BP 120/82 mmHg  Pulse 88  Temp(Src) 98.4 F (36.9 C)  Resp 16  Ht 5\' 4"  (1.626 m)  Wt 248 lb 3.2 oz (112.583 kg)  BMI 42.58 kg/m2  SpO2 97%  Physical Exam  Constitutional: She is oriented to person, place, and time. She appears well-developed and well-nourished. No distress.  HENT:  Head: Normocephalic and atraumatic.  Right Ear: External ear normal.  Left Ear: External ear normal.  Genitourinary: Guaiac  negative stool.  Guiac negative  Neurological: She is alert and oriented to person, place, and time. No cranial nerve deficit. She exhibits normal muscle tone. Coordination normal.  Skin: Skin is warm and dry. No rash noted. She is not diaphoretic.  Psychiatric: She has a normal mood and affect. Her behavior is normal. Judgment and thought content normal.   Assessment & Plan:   Estee was seen today for rectal bleeding.  Diagnoses and all orders for this visit:  Rectal irritation  Anal bleeding -      IFOBT POC (occult bld, rslt in office)  I am having Ms. Woodrome maintain her cyclobenzaprine, Eszopiclone, nystatin-triamcinolone ointment, DEXILANT, ALPRAZolam, amLODipine, irbesartan, lovastatin, and montelukast. We will continue to administer triamcinolone acetonide.  No orders of the defined types were placed in this encounter.     Follow-up: Return if symptoms worsen or fail to improve.

## 2014-11-03 NOTE — Patient Instructions (Signed)

## 2014-11-03 NOTE — Assessment & Plan Note (Signed)
Worsening. Anal bleeding has not happened the past two times exercising this week. Colonoscopy UTD. Hemoccult negative in office. Pt was understanding of results. Discussed possible internal hemorrhoids. Denies hard stools, but encouraged fiber and good water intake. Pt declined referral to gastroenterology and will let us know if it returns. Continue to use OTC Preparation H. FU prn worsening/failure to improve.

## 2014-11-03 NOTE — Progress Notes (Signed)
Pre visit review using our clinic review tool, if applicable. No additional management support is needed unless otherwise documented below in the visit note. 

## 2014-11-23 ENCOUNTER — Other Ambulatory Visit: Payer: Self-pay | Admitting: Internal Medicine

## 2014-12-20 ENCOUNTER — Encounter: Payer: Self-pay | Admitting: Physician Assistant

## 2014-12-20 ENCOUNTER — Ambulatory Visit: Payer: Self-pay | Admitting: Physician Assistant

## 2014-12-20 VITALS — BP 150/80 | HR 92 | Temp 98.9°F

## 2014-12-20 DIAGNOSIS — J069 Acute upper respiratory infection, unspecified: Secondary | ICD-10-CM

## 2014-12-20 MED ORDER — AZITHROMYCIN 250 MG PO TABS
ORAL_TABLET | ORAL | Status: DC
Start: 2014-12-20 — End: 2015-01-31

## 2014-12-20 MED ORDER — HYDROCOD POLST-CPM POLST ER 10-8 MG/5ML PO SUER: 5.0000 mL | Freq: Two times a day (BID) | ORAL | Status: DC | PRN

## 2014-12-20 NOTE — Progress Notes (Signed)
S: C/o cough and congestion for 2 weeks, chest is sore from coughing, denies fever, chills, mucus is green, or cough is dry and hacking; keeping pt awake at night;  denies cardiac type chest pain or sob, v/d, abd pain Remainder ros neg  O: vitals wnl, nad, tms clear, throat injected, neck supple no lymph, lungs c t a, cv rrr, neuro intact, cough is dry and hacking  A:  Acute bronchitis, uri   P:  rx medication: zpack, tussionex 151ml nr ;use otc meds, tylenol or motrin as needed for fever/chills, return if not better in 3 -5 days, return earlier if worsening

## 2015-01-04 ENCOUNTER — Other Ambulatory Visit: Payer: Self-pay | Admitting: *Deleted

## 2015-01-04 MED ORDER — ALPRAZOLAM 0.5 MG PO TABS: 0.5000 mg | ORAL_TABLET | Freq: Every evening | ORAL | Status: DC | PRN

## 2015-01-04 NOTE — Telephone Encounter (Signed)
Recieved faxed refill request from [facility].  Last filled 04/12/14, Last OV /19/16.  Okay to refill?

## 2015-01-31 ENCOUNTER — Ambulatory Visit (INDEPENDENT_AMBULATORY_CARE_PROVIDER_SITE_OTHER): Payer: PRIVATE HEALTH INSURANCE | Admitting: Internal Medicine

## 2015-01-31 ENCOUNTER — Encounter: Payer: Self-pay | Admitting: Internal Medicine

## 2015-01-31 ENCOUNTER — Other Ambulatory Visit (HOSPITAL_COMMUNITY)
Admission: RE | Admit: 2015-01-31 | Discharge: 2015-01-31 | Disposition: A | Discharge status: Home / Self Care | Payer: PRIVATE HEALTH INSURANCE | Source: Ambulatory Visit | Attending: Internal Medicine | Admitting: Internal Medicine

## 2015-01-31 ENCOUNTER — Other Ambulatory Visit: Payer: Self-pay | Admitting: Internal Medicine

## 2015-01-31 VITALS — BP 132/78 | HR 82 | Temp 98.4°F | Ht 64.5 in | Wt 252.0 lb

## 2015-01-31 DIAGNOSIS — Z1151 Encounter for screening for human papillomavirus (HPV): Secondary | ICD-10-CM | POA: Insufficient documentation

## 2015-01-31 DIAGNOSIS — N183 Chronic kidney disease, stage 3 unspecified: Secondary | ICD-10-CM

## 2015-01-31 DIAGNOSIS — Z0001 Encounter for general adult medical examination with abnormal findings: Secondary | ICD-10-CM

## 2015-01-31 DIAGNOSIS — N952 Postmenopausal atrophic vaginitis: Secondary | ICD-10-CM

## 2015-01-31 DIAGNOSIS — Z01419 Encounter for gynecological examination (general) (routine) without abnormal findings: Secondary | ICD-10-CM | POA: Insufficient documentation

## 2015-01-31 DIAGNOSIS — Z0000 Encounter for general adult medical examination without abnormal findings: Secondary | ICD-10-CM | POA: Diagnosis not present

## 2015-01-31 DIAGNOSIS — I10 Essential (primary) hypertension: Secondary | ICD-10-CM

## 2015-01-31 DIAGNOSIS — N950 Postmenopausal bleeding: Secondary | ICD-10-CM

## 2015-01-31 DIAGNOSIS — E119 Type 2 diabetes mellitus without complications: Secondary | ICD-10-CM | POA: Diagnosis not present

## 2015-01-31 LAB — CBC WITH DIFFERENTIAL/PLATELET
BASOS PCT: 0.4 % (ref 0.0–3.0)
Basophils Absolute: 0 10*3/uL (ref 0.0–0.1)
Eosinophils Absolute: 0.2 10*3/uL (ref 0.0–0.7)
Eosinophils Relative: 2.1 % (ref 0.0–5.0)
HEMATOCRIT: 38.3 % (ref 36.0–46.0)
Hemoglobin: 12.3 g/dL (ref 12.0–15.0)
LYMPHS PCT: 23.5 % (ref 12.0–46.0)
Lymphs Abs: 2.4 10*3/uL (ref 0.7–4.0)
MCHC: 32.1 g/dL (ref 30.0–36.0)
MCV: 86.4 fl (ref 78.0–100.0)
MONO ABS: 0.6 10*3/uL (ref 0.1–1.0)
MONOS PCT: 5.8 % (ref 3.0–12.0)
Neutro Abs: 7 10*3/uL (ref 1.4–7.7)
Neutrophils Relative %: 68.2 % (ref 43.0–77.0)
Platelets: 272 10*3/uL (ref 150.0–400.0)
RBC: 4.43 Mil/uL (ref 3.87–5.11)
RDW: 14.2 % (ref 11.5–15.5)
WBC: 10.3 10*3/uL (ref 4.0–10.5)

## 2015-01-31 LAB — COMPREHENSIVE METABOLIC PANEL
ALT: 17 U/L (ref 0–35)
AST: 20 U/L (ref 0–37)
Albumin: 4.1 g/dL (ref 3.5–5.2)
Alkaline Phosphatase: 80 U/L (ref 39–117)
BUN: 17 mg/dL (ref 6–23)
CALCIUM: 9.5 mg/dL (ref 8.4–10.5)
CHLORIDE: 102 meq/L (ref 96–112)
CO2: 29 mEq/L (ref 19–32)
Creatinine, Ser: 1.24 mg/dL — ABNORMAL HIGH (ref 0.40–1.20)
GFR: 56.62 mL/min — ABNORMAL LOW (ref >60.00)
Glucose, Bld: 103 mg/dL — ABNORMAL HIGH (ref 70–99)
POTASSIUM: 3.8 meq/L (ref 3.5–5.1)
SODIUM: 138 meq/L (ref 135–145)
Total Bilirubin: 0.5 mg/dL (ref 0.2–1.2)
Total Protein: 6.8 g/dL (ref 6.0–8.3)

## 2015-01-31 LAB — LIPID PANEL
CHOLESTEROL: 180 mg/dL (ref 0–200)
HDL: 40.8 mg/dL (ref >39.00)
LDL CALC: 121 mg/dL — AB (ref 0–99)
NonHDL: 138.85
TRIGLYCERIDES: 89 mg/dL (ref 0.0–149.0)
Total CHOL/HDL Ratio: 4
VLDL: 17.8 mg/dL (ref 0.0–40.0)

## 2015-01-31 LAB — POCT URINALYSIS DIPSTICK
Bilirubin, UA: NEGATIVE
GLUCOSE UA: NEGATIVE
Ketones, UA: NEGATIVE
Nitrite, UA: NEGATIVE
PROTEIN UA: NEGATIVE
SPEC GRAV UA: 1.015
UROBILINOGEN UA: 0.2
pH, UA: 5.5

## 2015-01-31 LAB — VITAMIN D 25 HYDROXY (VIT D DEFICIENCY, FRACTURES): VITD: 23.41 ng/mL — ABNORMAL LOW (ref 30.00–100.00)

## 2015-01-31 LAB — MICROALBUMIN / CREATININE URINE RATIO
CREATININE, U: 97.2 mg/dL
Microalb Creat Ratio: 4.1 mg/g (ref 0.0–30.0)
Microalb, Ur: 4 mg/dL — ABNORMAL HIGH (ref 0.0–1.9)

## 2015-01-31 LAB — HEMOGLOBIN A1C: HEMOGLOBIN A1C: 6.1 % (ref 4.6–6.5)

## 2015-01-31 LAB — TSH: TSH: 1.75 u[IU]/mL (ref 0.35–4.50)

## 2015-01-31 MED ORDER — IRBESARTAN 150 MG PO TABS: ORAL_TABLET | ORAL | Status: DC

## 2015-01-31 NOTE — Progress Notes (Signed)
Pre visit review using our clinic review tool, if applicable. No additional management support is needed unless otherwise documented below in the visit note. 

## 2015-01-31 NOTE — Assessment & Plan Note (Signed)
General medical exam normal today except as noted. PAP pending. Mammogram UTD and reviewed. Immunizations UTD. Will set up referral for colonoscopy. Labs today. Encouraged healthy diet and exercise.Follow up 3 months and prn.

## 2015-01-31 NOTE — Assessment & Plan Note (Signed)
Will check A1c with labs today. 

## 2015-01-31 NOTE — Assessment & Plan Note (Signed)
BP Readings from Last 3 Encounters:  01/31/15 132/78  12/20/14 150/80  11/03/14 120/82   BP well controlled. Renal function today. Continue current medications.

## 2015-01-31 NOTE — Addendum Note (Signed)
Addended by: Karlene Einstein D on: 01/31/2015 02:04 PM   Modules accepted: Orders

## 2015-01-31 NOTE — Assessment & Plan Note (Signed)
Wt Readings from Last 3 Encounters:  01/31/15 252 lb (114.306 kg)  11/03/14 248 lb 3.2 oz (112.583 kg)  07/08/14 249 lb 2 oz (113.002 kg)   Body mass index is 42.6 kg/(m^2). The patient is asked to make an attempt to improve diet and exercise patterns to aid in medical management of this problem.

## 2015-01-31 NOTE — Patient Instructions (Signed)
Health Maintenance, Female Adopting a healthy lifestyle and getting preventive care can go a long way to promote health and wellness. Talk with your health care provider about what schedule of regular examinations is right for you. This is a good chance for you to check in with your provider about disease prevention and staying healthy. In between checkups, there are plenty of things you can do on your own. Experts have done a lot of research about which lifestyle changes and preventive measures are most likely to keep you healthy. Ask your health care provider for more information. WEIGHT AND DIET  Eat a healthy diet  Be sure to include plenty of vegetables, fruits, low-fat dairy products, and lean protein.  Do not eat a lot of foods high in solid fats, added sugars, or salt.  Get regular exercise. This is one of the most important things you can do for your health.  Most adults should exercise for at least 150 minutes each week. The exercise should increase your heart rate and make you sweat (moderate-intensity exercise).  Most adults should also do strengthening exercises at least twice a week. This is in addition to the moderate-intensity exercise.  Maintain a healthy weight  Body mass index (BMI) is a measurement that can be used to identify possible weight problems. It estimates body fat based on height and weight. Your health care provider can help determine your BMI and help you achieve or maintain a healthy weight.  For females 20 years of age and older:   A BMI below 18.5 is considered underweight.  A BMI of 18.5 to 24.9 is normal.  A BMI of 25 to 29.9 is considered overweight.  A BMI of 30 and above is considered obese.  Watch levels of cholesterol and blood lipids  You should start having your blood tested for lipids and cholesterol at 60 years of age, then have this test every 5 years.  You may need to have your cholesterol levels checked more often if:  Your lipid  or cholesterol levels are high.  You are older than 60 years of age.  You are at high risk for heart disease.  CANCER SCREENING   Lung Cancer  Lung cancer screening is recommended for adults 55-80 years old who are at high risk for lung cancer because of a history of smoking.  A yearly low-dose CT scan of the lungs is recommended for people who:  Currently smoke.  Have quit within the past 15 years.  Have at least a 30-pack-year history of smoking. A pack year is smoking an average of one pack of cigarettes a day for 1 year.  Yearly screening should continue until it has been 15 years since you quit.  Yearly screening should stop if you develop a health problem that would prevent you from having lung cancer treatment.  Breast Cancer  Practice breast self-awareness. This means understanding how your breasts normally appear and feel.  It also means doing regular breast self-exams. Let your health care provider know about any changes, no matter how small.  If you are in your 20s or 30s, you should have a clinical breast exam (CBE) by a health care provider every 1-3 years as part of a regular health exam.  If you are 40 or older, have a CBE every year. Also consider having a breast X-ray (mammogram) every year.  If you have a family history of breast cancer, talk to your health care provider about genetic screening.  If you   are at high risk for breast cancer, talk to your health care provider about having an MRI and a mammogram every year.  Breast cancer gene (BRCA) assessment is recommended for women who have family members with BRCA-related cancers. BRCA-related cancers include:  Breast.  Ovarian.  Tubal.  Peritoneal cancers.  Results of the assessment will determine the need for genetic counseling and BRCA1 and BRCA2 testing. Cervical Cancer Your health care provider may recommend that you be screened regularly for cancer of the pelvic organs (ovaries, uterus, and  vagina). This screening involves a pelvic examination, including checking for microscopic changes to the surface of your cervix (Pap test). You may be encouraged to have this screening done every 3 years, beginning at age 21.  For women ages 30-65, health care providers may recommend pelvic exams and Pap testing every 3 years, or they may recommend the Pap and pelvic exam, combined with testing for human papilloma virus (HPV), every 5 years. Some types of HPV increase your risk of cervical cancer. Testing for HPV may also be done on women of any age with unclear Pap test results.  Other health care providers may not recommend any screening for nonpregnant women who are considered low risk for pelvic cancer and who do not have symptoms. Ask your health care provider if a screening pelvic exam is right for you.  If you have had past treatment for cervical cancer or a condition that could lead to cancer, you need Pap tests and screening for cancer for at least 20 years after your treatment. If Pap tests have been discontinued, your risk factors (such as having a new sexual partner) need to be reassessed to determine if screening should resume. Some women have medical problems that increase the chance of getting cervical cancer. In these cases, your health care provider may recommend more frequent screening and Pap tests. Colorectal Cancer  This type of cancer can be detected and often prevented.  Routine colorectal cancer screening usually begins at 60 years of age and continues through 60 years of age.  Your health care provider may recommend screening at an earlier age if you have risk factors for colon cancer.  Your health care provider may also recommend using home test kits to check for hidden blood in the stool.  A small camera at the end of a tube can be used to examine your colon directly (sigmoidoscopy or colonoscopy). This is done to check for the earliest forms of colorectal  cancer.  Routine screening usually begins at age 50.  Direct examination of the colon should be repeated every 5-10 years through 60 years of age. However, you may need to be screened more often if early forms of precancerous polyps or small growths are found. Skin Cancer  Check your skin from head to toe regularly.  Tell your health care provider about any new moles or changes in moles, especially if there is a change in a mole's shape or color.  Also tell your health care provider if you have a mole that is larger than the size of a pencil eraser.  Always use sunscreen. Apply sunscreen liberally and repeatedly throughout the day.  Protect yourself by wearing long sleeves, pants, a wide-brimmed hat, and sunglasses whenever you are outside. HEART DISEASE, DIABETES, AND HIGH BLOOD PRESSURE   High blood pressure causes heart disease and increases the risk of stroke. High blood pressure is more likely to develop in:  People who have blood pressure in the high end   of the normal range (130-139/85-89 mm Hg).  People who are overweight or obese.  People who are African American.  If you are 38-23 years of age, have your blood pressure checked every 3-5 years. If you are 61 years of age or older, have your blood pressure checked every year. You should have your blood pressure measured twice--once when you are at a hospital or clinic, and once when you are not at a hospital or clinic. Record the average of the two measurements. To check your blood pressure when you are not at a hospital or clinic, you can use:  An automated blood pressure machine at a pharmacy.  A home blood pressure monitor.  If you are between 45 years and 39 years old, ask your health care provider if you should take aspirin to prevent strokes.  Have regular diabetes screenings. This involves taking a blood sample to check your fasting blood sugar level.  If you are at a normal weight and have a low risk for diabetes,  have this test once every three years after 60 years of age.  If you are overweight and have a high risk for diabetes, consider being tested at a younger age or more often. PREVENTING INFECTION  Hepatitis B  If you have a higher risk for hepatitis B, you should be screened for this virus. You are considered at high risk for hepatitis B if:  You were born in a country where hepatitis B is common. Ask your health care provider which countries are considered high risk.  Your parents were born in a high-risk country, and you have not been immunized against hepatitis B (hepatitis B vaccine).  You have HIV or AIDS.  You use needles to inject street drugs.  You live with someone who has hepatitis B.  You have had sex with someone who has hepatitis B.  You get hemodialysis treatment.  You take certain medicines for conditions, including cancer, organ transplantation, and autoimmune conditions. Hepatitis C  Blood testing is recommended for:  Everyone born from [birth year] through 1966.  Anyone with known risk factors for hepatitis C. Sexually transmitted infections (STIs)  You should be screened for sexually transmitted infections (STIs) including gonorrhea and chlamydia if:  You are sexually active and are younger than 60 years of age.  You are older than 60 years of age and your health care provider tells you that you are at risk for this type of infection.  Your sexual activity has changed since you were last screened and you are at an increased risk for chlamydia or gonorrhea. Ask your health care provider if you are at risk.  If you do not have HIV, but are at risk, it may be recommended that you take a prescription medicine daily to prevent HIV infection. This is called pre-exposure prophylaxis (PrEP). You are considered at risk if:  You are sexually active and do not regularly use condoms or know the HIV status of your partner(s).  You take drugs by injection.  You are sexually  active with a partner who has HIV. Talk with your health care provider about whether you are at high risk of being infected with HIV. If you choose to begin PrEP, you should first be tested for HIV. You should then be tested every 3 months for as long as you are taking PrEP.  PREGNANCY   If you are premenopausal and you may become pregnant, ask your health care provider about preconception counseling.  If you may  become pregnant, take 400 to 800 micrograms (mcg) of folic acid every day.  If you want to prevent pregnancy, talk to your health care provider about birth control (contraception). OSTEOPOROSIS AND MENOPAUSE   Osteoporosis is a disease in which the bones lose minerals and strength with aging. This can result in serious bone fractures. Your risk for osteoporosis can be identified using a bone density scan.  If you are 61 years of age or older, or if you are at risk for osteoporosis and fractures, ask your health care provider if you should be screened.  Ask your health care provider whether you should take a calcium or vitamin D supplement to lower your risk for osteoporosis.  Menopause may have certain physical symptoms and risks.  Hormone replacement therapy may reduce some of these symptoms and risks. Talk to your health care provider about whether hormone replacement therapy is right for you.  HOME CARE INSTRUCTIONS   Schedule regular health, dental, and eye exams.  Stay current with your immunizations.   Do not use any tobacco products including cigarettes, chewing tobacco, or electronic cigarettes.  If you are pregnant, do not drink alcohol.  If you are breastfeeding, limit how much and how often you drink alcohol.  Limit alcohol intake to no more than 1 drink per day for nonpregnant women. One drink equals 12 ounces of beer, 5 ounces of wine, or 1 ounces of hard liquor.  Do not use street drugs.  Do not share needles.  Ask your health care provider for help if  you need support or information about quitting drugs.  Tell your health care provider if you often feel depressed.  Tell your health care provider if you have ever been abused or do not feel safe at home.   This information is not intended to replace advice given to you by your health care provider. Make sure you discuss any questions you have with your health care provider.   Document Released: 08/21/2010 Document Revised: 02/26/2014 Document Reviewed: 01/07/2013 Elsevier Interactive Patient Education [employer].

## 2015-01-31 NOTE — Progress Notes (Signed)
Subjective:    Patient ID: Lindsey Caldwell, female    DOB: 07-17-1955, 59 y.o.   MRN: [id number]  HPI  59YO female presents for annual exam.  Feeling well except for some vaginal discharge. Notes brownish discharge. Not painful. Last menses over 10 years ago. Also notes some spotting with intercourse with her husband on one occasion. Has pain with intercourse. Using some lubricant with some improvement. Last PAP was 2014.   Wt Readings from Last 3 Encounters:  01/31/15 252 lb (114.306 kg)  11/03/14 248 lb 3.2 oz (112.583 kg)  07/08/14 249 lb 2 oz (113.002 kg)   BP Readings from Last 3 Encounters:  01/31/15 132/78  12/20/14 150/80  11/03/14 120/82    Past Medical History  Diagnosis Date  . Diabetes mellitus without complication ([place])   . Hypertension   . Anxiety   . High cholesterol   . Cholestanol storage disease   . GERD (gastroesophageal reflux disease)    Family History  Problem Relation Age of Onset  . Cancer Father     Hodgkins  . Diabetes Sister   . Cancer Sister     brain tumor  . Cancer Sister     breast  . Breast cancer Sister 6   Past Surgical History  Procedure Laterality Date  . Back surgery  07/12/10    Dr. Arnoldo Morale  . Knee surgery  08/01/12   Social History   Social History  . Marital Status: Married    Spouse Name: N/A  . Number of Children: N/A  . Years of Education: N/A   Social History Main Topics  . Smoking status: Former Research scientist (life sciences)  . Smokeless tobacco: Never Used     Comment: quit 16 years ago   . Alcohol Use: No  . Drug Use: No  . Sexual Activity: Not Asked   Other Topics Concern  . None   Social History Narrative   Lives in [place] with husband and granddaughter. Has 2 sons. One lives in [place] and one in [place]. No pets.      Work - Management consultant as a guard, alternating days and night    Review of Systems  Constitutional: Negative for fever, chills, appetite change, fatigue and unexpected weight change.    Eyes: Negative for visual disturbance.  Respiratory: Negative for shortness of breath.   Cardiovascular: Negative for chest pain and leg swelling.  Gastrointestinal: Negative for nausea, vomiting, abdominal pain, diarrhea and constipation.  Genitourinary: Positive for vaginal bleeding, vaginal discharge and vaginal pain. Negative for dysuria, hematuria, flank pain, genital sores, menstrual problem and pelvic pain.  Musculoskeletal: Negative for myalgias and arthralgias.  Skin: Negative for color change and rash.  Hematological: Negative for adenopathy. Does not bruise/bleed easily.  Psychiatric/Behavioral: Negative for sleep disturbance and dysphoric mood. The patient is not nervous/anxious.        Objective:    BP 132/78 mmHg  Pulse 82  Temp(Src) 98.4 F (36.9 C) (Oral)  Ht 5' 4.5" (1.638 m)  Wt 252 lb (114.306 kg)  BMI 42.60 kg/m2  SpO2 98% Physical Exam  Constitutional: She is oriented to person, place, and time. She appears well-developed and well-nourished. No distress.  HENT:  Head: Normocephalic and atraumatic.  Right Ear: External ear normal.  Left Ear: External ear normal.  Nose: Nose normal.  Mouth/Throat: Oropharynx is clear and moist. No oropharyngeal exudate.  Eyes: Conjunctivae are normal. Pupils are equal, round, and reactive to light. Right eye exhibits no discharge. Left eye exhibits no  discharge. No scleral icterus.  Neck: Normal range of motion. Neck supple. No tracheal deviation present. No thyromegaly present.  Cardiovascular: Normal rate, regular rhythm, normal heart sounds and intact distal pulses.  Exam reveals no gallop and no friction rub.   No murmur heard. Pulmonary/Chest: Effort normal and breath sounds normal. No respiratory distress. She has no wheezes. She has no rales. She exhibits no tenderness.  Abdominal: Soft. Bowel sounds are normal. She exhibits no distension and no mass. There is no tenderness. There is no rebound and no guarding.   Genitourinary: Rectum normal, vagina normal and uterus normal. No breast swelling, tenderness, discharge or bleeding. Pelvic exam was performed with patient supine. There is no rash, tenderness or lesion on the right labia. There is no rash, tenderness or lesion on the left labia. Uterus is not enlarged and not tender. Cervix exhibits no motion tenderness, no discharge and no friability. Right adnexum displays no mass, no tenderness and no fullness. Left adnexum displays no mass, no tenderness and no fullness. No erythema or tenderness in the vagina. No vaginal discharge found.  Musculoskeletal: Normal range of motion. She exhibits no edema or tenderness.  Lymphadenopathy:    She has no cervical adenopathy.  Neurological: She is alert and oriented to person, place, and time. No cranial nerve deficit. She exhibits normal muscle tone. Coordination normal.  Skin: Skin is warm and dry. No rash noted. She is not diaphoretic. No erythema. No pallor.  Psychiatric: She has a normal mood and affect. Her behavior is normal. Judgment and thought content normal.          Assessment & Plan:   Problem List Items Addressed This Visit      Unprioritized   Atrophic vaginitis    Symptoms of dyspareunia and vaginal discharge with occasional spotting most likely secondary to atrophic vaginitis. PAP sent today. Will also set up pelvic US to evaluate endometrial lining. We discussed referral to GYN for endometrial biopsy, but will hold off for now until [place] back. Discussed using lubricants to help with pain with intercourse. Also discussed adding Premarin in the future.      Relevant Orders   POCT Urinalysis Dipstick (Completed)   Chronic kidney disease (CKD), stage III (moderate)    Renal function with labs today.      Diabetes type 2, controlled ([place])    Will check A1c with labs today.      Relevant Medications   irbesartan (AVAPRO) 150 MG tablet   Hypertension    BP Readings from Last 3 Encounters:   01/31/15 132/78  12/20/14 150/80  11/03/14 120/82   BP well controlled. Renal function today. Continue current medications.      Relevant Medications   irbesartan (AVAPRO) 150 MG tablet   Morbid obesity ([facility])    Wt Readings from Last 3 Encounters:  01/31/15 252 lb (114.306 kg)  11/03/14 248 lb 3.2 oz (112.583 kg)  07/08/14 249 lb 2 oz (113.002 kg)   Body mass index is 42.6 kg/(m^2). The patient is asked to make an attempt to improve diet and exercise patterns to aid in medical management of this problem.       Postmenopausal vaginal bleeding    As noted, spotting only with intercourse, likely from atrophic vaginitis, however will set up pelvic US to evaluate endometrial lining.      Relevant Orders   US Pelvis Complete   Routine general medical examination at a health care facility - Primary    General medical  exam normal today except as noted. PAP pending. Mammogram UTD and reviewed. Immunizations UTD. Will set up referral for colonoscopy. Labs today. Encouraged healthy diet and exercise.Follow up 3 months and prn.      Relevant Orders   CBC with Differential/Platelet   Comprehensive metabolic panel   Lipid panel   Microalbumin / creatinine urine ratio   VITAMIN D 25 Hydroxy (Vit-D Deficiency, Fractures)   Hemoglobin A1c   TSH   Ambulatory referral to Gastroenterology       Return in about 3 months (around 05/01/2015) for Recheck.

## 2015-01-31 NOTE — Assessment & Plan Note (Signed)
As noted, spotting only with intercourse, likely from atrophic vaginitis, however will set up pelvic US to evaluate endometrial lining.

## 2015-01-31 NOTE — Assessment & Plan Note (Signed)
Renal function with labs today.  

## 2015-01-31 NOTE — Assessment & Plan Note (Addendum)
Symptoms of dyspareunia and vaginal discharge with occasional spotting most likely secondary to atrophic vaginitis. PAP sent today. Will also set up pelvic US to evaluate endometrial lining. We discussed referral to GYN for endometrial biopsy, but will hold off for now until [place] back. Discussed using lubricants to help with pain with intercourse. Also discussed adding Premarin in the future.

## 2015-02-01 ENCOUNTER — Encounter: Payer: Self-pay | Admitting: *Deleted

## 2015-02-01 LAB — CYTOLOGY - PAP

## 2015-02-02 LAB — URINE CULTURE

## 2015-02-03 ENCOUNTER — Ambulatory Visit
Admission: RE | Admit: 2015-02-03 | Discharge: 2015-02-03 | Disposition: A | Discharge status: Home / Self Care | Payer: PRIVATE HEALTH INSURANCE | Source: Ambulatory Visit | Attending: Internal Medicine | Admitting: Internal Medicine

## 2015-02-03 ENCOUNTER — Other Ambulatory Visit: Payer: Self-pay | Admitting: Internal Medicine

## 2015-02-03 DIAGNOSIS — N950 Postmenopausal bleeding: Secondary | ICD-10-CM | POA: Diagnosis present

## 2015-02-03 DIAGNOSIS — D259 Leiomyoma of uterus, unspecified: Secondary | ICD-10-CM | POA: Diagnosis not present

## 2015-02-04 ENCOUNTER — Telehealth: Payer: Self-pay | Admitting: Internal Medicine

## 2015-02-04 NOTE — Telephone Encounter (Signed)
Pt called back. States that it was about her lab work. Please call patient

## 2015-02-04 NOTE — Telephone Encounter (Signed)
Spoke with the patient, reviewed all lab results and she agreed to come and give a new specimen for the urine test.

## 2015-02-04 NOTE — Telephone Encounter (Signed)
Pt lvm stating that we left a message for her yesterday and she needs to talk about it. I do not know who called her yesterday and she did not say what the message was pertaining to.

## 2015-02-07 ENCOUNTER — Ambulatory Visit: Payer: PRIVATE HEALTH INSURANCE

## 2015-02-09 ENCOUNTER — Other Ambulatory Visit: Payer: Self-pay | Admitting: *Deleted

## 2015-02-09 DIAGNOSIS — N390 Urinary tract infection, site not specified: Secondary | ICD-10-CM

## 2015-02-11 LAB — URINE CULTURE
COLONY COUNT: NO GROWTH
Organism ID, Bacteria: NO GROWTH

## 2015-03-04 ENCOUNTER — Encounter: Payer: Self-pay | Admitting: Physician Assistant

## 2015-03-04 ENCOUNTER — Ambulatory Visit: Payer: Self-pay | Admitting: Physician Assistant

## 2015-03-04 VITALS — BP 120/80 | HR 80 | Temp 98.5°F

## 2015-03-04 DIAGNOSIS — R197 Diarrhea, unspecified: Secondary | ICD-10-CM

## 2015-03-04 DIAGNOSIS — R112 Nausea with vomiting, unspecified: Secondary | ICD-10-CM

## 2015-03-04 LAB — GLUCOSE, POCT (MANUAL RESULT ENTRY): POC Glucose: 85 mg/dl (ref 70–99)

## 2015-03-04 MED ORDER — DIPHENOXYLATE-ATROPINE 2.5-0.025 MG PO TABS: 1.0000 | ORAL_TABLET | Freq: Four times a day (QID) | ORAL | Status: DC | PRN

## 2015-03-04 MED ORDER — PROMETHAZINE HCL 25 MG PO TABS: 25.0000 mg | ORAL_TABLET | Freq: Three times a day (TID) | ORAL | Status: DC | PRN

## 2015-03-04 NOTE — Patient Instructions (Signed)
Food Choices to Help Relieve Diarrhea, Adult When you have diarrhea, the foods you eat and your eating habits are very important. Choosing the right foods and drinks can help relieve diarrhea. Also, because diarrhea can last up to 7 days, you need to replace lost fluids and electrolytes (such as sodium, potassium, and chloride) in order to help prevent dehydration.  WHAT GENERAL GUIDELINES DO I NEED TO FOLLOW?  Slowly drink 1 cup (8 oz) of fluid for each episode of diarrhea. If you are getting enough fluid, your urine will be clear or pale yellow.  Eat starchy foods. Some good choices include white rice, white toast, pasta, low-fiber cereal, baked potatoes (without the skin), saltine crackers, and bagels.  Avoid large servings of any cooked vegetables.  Limit fruit to two servings per day. A serving is  cup or 1 small piece.  Choose foods with less than 2 g of fiber per serving.  Limit fats to less than 8 tsp (38 g) per day.  Avoid fried foods.  Eat foods that have probiotics in them. Probiotics can be found in certain dairy products.  Avoid foods and beverages that may increase the speed at which food moves through the stomach and intestines (gastrointestinal tract). Things to avoid include:  High-fiber foods, such as dried fruit, raw fruits and vegetables, nuts, seeds, and whole grain foods.  Spicy foods and high-fat foods.  Foods and beverages sweetened with high-fructose corn syrup, honey, or sugar alcohols such as xylitol, sorbitol, and mannitol. WHAT FOODS ARE RECOMMENDED? Grains White rice. White, French, or pita breads (fresh or toasted), including plain rolls, buns, or bagels. White pasta. Saltine, soda, or graham crackers. Pretzels. Low-fiber cereal. Cooked cereals made with water (such as cornmeal, farina, or cream cereals). Plain muffins. Matzo. Melba toast. Zwieback.  Vegetables Potatoes (without the skin). Strained tomato and vegetable juices. Most well-cooked and canned  vegetables without seeds. Tender lettuce. Fruits Cooked or canned applesauce, apricots, cherries, fruit cocktail, grapefruit, peaches, pears, or plums. Fresh bananas, apples without skin, cherries, grapes, cantaloupe, grapefruit, peaches, oranges, or plums.  Meat and Other Protein Products Baked or boiled chicken. Eggs. Tofu. Fish. Seafood. Smooth peanut butter. Ground or well-cooked tender beef, ham, veal, lamb, pork, or poultry.  Dairy Plain yogurt, kefir, and unsweetened liquid yogurt. Lactose-free milk, buttermilk, or soy milk. Plain hard cheese. Beverages Sport drinks. Clear broths. Diluted fruit juices (except prune). Regular, caffeine-free sodas such as ginger ale. Water. Decaffeinated teas. Oral rehydration solutions. Sugar-free beverages not sweetened with sugar alcohols. Other Bouillon, broth, or soups made from recommended foods.  The items listed above may not be a complete list of recommended foods or beverages. Contact your dietitian for more options. WHAT FOODS ARE NOT RECOMMENDED? Grains Whole grain, whole wheat, bran, or rye breads, rolls, pastas, crackers, and cereals. Wild or brown rice. Cereals that contain more than 2 g of fiber per serving. Corn tortillas or taco shells. Cooked or dry oatmeal. Granola. Popcorn. Vegetables Raw vegetables. Cabbage, broccoli, Brussels sprouts, artichokes, baked beans, beet greens, corn, kale, legumes, peas, sweet potatoes, and yams. Potato skins. Cooked spinach and cabbage. Fruits Dried fruit, including raisins and dates. Raw fruits. Stewed or dried prunes. Fresh apples with skin, apricots, mangoes, pears, raspberries, and strawberries.  Meat and Other Protein Products Chunky peanut butter. Nuts and seeds. Beans and lentils. Bacon.  Dairy High-fat cheeses. Milk, chocolate milk, and beverages made with milk, such as milk shakes. Cream. Ice cream. Sweets and Desserts Sweet rolls, doughnuts, and sweet breads.   Pancakes and waffles. Fats and  Oils Butter. Cream sauces. Margarine. Salad oils. Plain salad dressings. Olives. Avocados.  Beverages Caffeinated beverages (such as coffee, tea, soda, or energy drinks). Alcoholic beverages. Fruit juices with pulp. Prune juice. Soft drinks sweetened with high-fructose corn syrup or sugar alcohols. Other Coconut. Hot sauce. Chili powder. Mayonnaise. Gravy. Cream-based or milk-based soups.  The items listed above may not be a complete list of foods and beverages to avoid. Contact your dietitian for more information. WHAT SHOULD I DO IF I BECOME DEHYDRATED? Diarrhea can sometimes lead to dehydration. Signs of dehydration include dark urine and dry mouth and skin. If you think you are dehydrated, you should rehydrate with an oral rehydration solution. These solutions can be purchased at pharmacies, retail stores, or online.  Drink -1 cup (120-240 mL) of oral rehydration solution each time you have an episode of diarrhea. If drinking this amount makes your diarrhea worse, try drinking smaller amounts more often. For example, drink 1-3 tsp (5-15 mL) every 5-10 minutes.  A general rule for staying hydrated is to drink 1-2 L of fluid per day. Talk to your health care provider about the specific amount you should be drinking each day. Drink enough fluids to keep your urine clear or pale yellow.   This information is not intended to replace advice given to you by your health care provider. Make sure you discuss any questions you have with your health care provider.   Document Released: 04/28/2003 Document Revised: 02/26/2014 Document Reviewed: 12/29/2012 Elsevier Interactive Patient Education 2016 [place]. Diarrhea Diarrhea is watery poop (stool). It can make you feel weak, tired, thirsty, or give you a dry mouth (signs of dehydration). Watery poop is a sign of another problem, most often an infection. It often lasts 2-3 days. It can last longer if it is a sign of something serious. Take care of  yourself as told by your doctor. HOME CARE   Drink 1 cup (8 ounces) of fluid each time you have watery poop.  Do not drink the following fluids:  Those that contain simple sugars (fructose, glucose, galactose, lactose, sucrose, maltose).  Sports drinks.  Fruit juices.  Whole milk products.  Sodas.  Drinks with caffeine (coffee, tea, soda) or alcohol.  Oral rehydration solution may be used if the doctor says it is okay. You may make your own solution. Follow this recipe:   - teaspoon table salt.   teaspoon baking soda.   teaspoon salt substitute containing potassium chloride.  1 tablespoons sugar.  1 liter (34 ounces) of water.  Avoid the following foods:  High fiber foods, such as raw fruits and vegetables.  Nuts, seeds, and whole grain breads and cereals.   Those that are sweetened with sugar alcohols (xylitol, sorbitol, mannitol).  Try eating the following foods:  Starchy foods, such as rice, toast, pasta, low-sugar cereal, oatmeal, baked potatoes, crackers, and bagels.  Bananas.  Applesauce.  Eat probiotic-rich foods, such as yogurt and milk products that are fermented.  Wash your hands well after each time you have watery poop.  Only take medicine as told by your doctor.  Take a warm bath to help lessen burning or pain from having watery poop. GET HELP RIGHT AWAY IF:   You cannot drink fluids without throwing up (vomiting).  You keep throwing up.  You have blood in your poop, or your poop looks black and tarry.  You do not pee (urinate) in 6-8 hours, or there is only a small amount  of very dark pee.  You have belly (abdominal) pain that gets worse or stays in the same spot (localizes).  You are weak, dizzy, confused, or light-headed.  You have a very bad headache.  Your watery poop gets worse or does not get better.  You have a fever or lasting symptoms for more than 2-3 days.  You have a fever and your symptoms suddenly get worse. MAKE  SURE YOU:   Understand these instructions.  Will watch your condition.  Will get help right away if you are not doing well or get worse.   This information is not intended to replace advice given to you by your health care provider. Make sure you discuss any questions you have with your health care provider.   Document Released: 07/25/2007 Document Revised: 02/26/2014 Document Reviewed: 10/14/2011 Elsevier Interactive Patient Education 2016 Elsevier [employer] Probiotics WHAT ARE PROBIOTICS? Probiotics are the good bacteria and yeasts that live in your body and keep you and your digestive system healthy. Probiotics also help your body's defense (immune) system and protect your body against bad bacterial growth.  Certain foods contain probiotics, such as yogurt. Probiotics can also be purchased as a supplement. As with any supplement or drug, it is important to discuss its use with your health care provider.  WHAT AFFECTS THE BALANCE OF BACTERIA IN MY BODY? The balance of bacteria in your body can be affected by:   Antibiotic medicines. Antibiotics are sometimes necessary to treat infection. Unfortunately, they may kill good or friendly bacteria in your body as well as the bad bacteria. This may lead to stomach problems like diarrhea, gas, and cramping.  Disease. Some conditions are the result of an overgrowth of bad bacteria, yeasts, parasites, or fungi. These conditions include:   Infectious diarrhea.  Stomach and respiratory infections.  Skin infections.  Irritable bowel syndrome (IBS).  Inflammatory bowel diseases.  Ulcer due to Helicobacter pylori (H. pylori) infection.  Tooth decay and periodontal disease.  Vaginal infections. Stress and poor diet may also lower the good bacteria in your body.  WHAT TYPE OF PROBIOTIC IS RIGHT FOR ME? Probiotics are available over the counter at your local pharmacy, health food, or grocery store. They come in many different forms, combinations  of strains, and dosing strengths. Some may need to be refrigerated. Always read the label for storage and usage instructions. Specific strains have been shown to be more effective for certain conditions. Ask your health care provider what option is best for you.  WHY WOULD I NEED PROBIOTICS? There are many reasons your health care provider might recommend a probiotic supplement, including:   Diarrhea.  Constipation.  IBS.  Respiratory infections.  Yeast infections.  Acne, eczema, and other skin conditions.  Frequent urinary tract infections (UTIs). ARE THERE SIDE EFFECTS OF PROBIOTICS? Some people experience mild side effects when taking probiotics. Side effects are usually temporary and may include:   Gas.  Bloating.  Cramping. Rarely, serious side effects, such as infection or immune system changes, may occur. WHAT ELSE DO I NEED TO KNOW ABOUT PROBIOTICS?   There are many different strains of probiotics. Certain strains may be more effective depending on your condition. Probiotics are available in varying doses. Ask your health care provider which probiotic you should use and how often.   If you are taking probiotics along with antibiotics, it is generally recommended to wait at least 2 hours between taking the antibiotic and taking the probiotic.  FOR MORE INFORMATION:  [facility] [email or web address]   This information is not intended to replace advice given to you by your health care provider. Make sure you discuss any questions you have with your health care provider.   Document Released: 09/02/2013 Document Reviewed: 09/02/2013 Elsevier Interactive Patient Education [employer].

## 2015-03-04 NOTE — Progress Notes (Signed)
S:  Pt c/o nausea and diarrhea, sx for 3 days, no fever/chills, a little abd pain at suprapubic area, cramping with diarrhea; diarrhea is watery, immodium has helped but not stopped it completely, no vomiting; denies cp/sob, denies camping, bad food, recent antibiotics, or exposure to bad water Remainder ros neg  O:  Vitals wnl, nad, ENT wnl, neck supple no lymph, lungs c t a, cv rrr, abd soft nontender bs increased lower quads b/l, neuro intact  A:  Viral gastroenteritis  P:  Reassurance, fluids, brat diet, immodium ad for diarrhea if needed, rx lomotil if immodium not working, rx phenergan 25mg  tid prn vomiting, return if not better in 3 days, return earlier if worsening

## 2015-03-30 ENCOUNTER — Encounter: Payer: Self-pay | Admitting: Obstetrics and Gynecology

## 2015-04-01 ENCOUNTER — Ambulatory Visit: Payer: Self-pay | Admitting: Physician Assistant

## 2015-04-06 ENCOUNTER — Other Ambulatory Visit: Payer: Self-pay | Admitting: Internal Medicine

## 2015-04-12 ENCOUNTER — Encounter: Payer: Self-pay | Admitting: *Deleted

## 2015-04-13 ENCOUNTER — Encounter: Payer: Self-pay | Admitting: *Deleted

## 2015-04-13 ENCOUNTER — Ambulatory Visit: Payer: Managed Care, Other (non HMO) | Admitting: Anesthesiology

## 2015-04-13 ENCOUNTER — Ambulatory Visit
Admission: RE | Admit: 2015-04-13 | Discharge: 2015-04-13 | Disposition: A | Discharge status: Home / Self Care | Payer: Managed Care, Other (non HMO) | Source: Ambulatory Visit | Attending: Unknown Physician Specialty | Admitting: Unknown Physician Specialty

## 2015-04-13 ENCOUNTER — Encounter
Admission: RE | Disposition: A | Discharge status: Home / Self Care | Payer: Self-pay | Source: Ambulatory Visit | Attending: Unknown Physician Specialty

## 2015-04-13 DIAGNOSIS — E1122 Type 2 diabetes mellitus with diabetic chronic kidney disease: Secondary | ICD-10-CM | POA: Insufficient documentation

## 2015-04-13 DIAGNOSIS — Z808 Family history of malignant neoplasm of other organs or systems: Secondary | ICD-10-CM | POA: Insufficient documentation

## 2015-04-13 DIAGNOSIS — Z888 Allergy status to other drugs, medicaments and biological substances status: Secondary | ICD-10-CM | POA: Diagnosis not present

## 2015-04-13 DIAGNOSIS — Z87891 Personal history of nicotine dependence: Secondary | ICD-10-CM | POA: Insufficient documentation

## 2015-04-13 DIAGNOSIS — Z1211 Encounter for screening for malignant neoplasm of colon: Secondary | ICD-10-CM | POA: Diagnosis not present

## 2015-04-13 DIAGNOSIS — E669 Obesity, unspecified: Secondary | ICD-10-CM | POA: Diagnosis not present

## 2015-04-13 DIAGNOSIS — Z6841 Body Mass Index (BMI) 40.0 and over, adult: Secondary | ICD-10-CM | POA: Diagnosis not present

## 2015-04-13 DIAGNOSIS — N189 Chronic kidney disease, unspecified: Secondary | ICD-10-CM | POA: Insufficient documentation

## 2015-04-13 DIAGNOSIS — K219 Gastro-esophageal reflux disease without esophagitis: Secondary | ICD-10-CM | POA: Diagnosis not present

## 2015-04-13 DIAGNOSIS — Z803 Family history of malignant neoplasm of breast: Secondary | ICD-10-CM | POA: Diagnosis not present

## 2015-04-13 DIAGNOSIS — Z86010 Personal history of colonic polyps: Secondary | ICD-10-CM | POA: Diagnosis not present

## 2015-04-13 DIAGNOSIS — K640 First degree hemorrhoids: Secondary | ICD-10-CM | POA: Diagnosis not present

## 2015-04-13 DIAGNOSIS — G4730 Sleep apnea, unspecified: Secondary | ICD-10-CM | POA: Diagnosis not present

## 2015-04-13 DIAGNOSIS — D123 Benign neoplasm of transverse colon: Secondary | ICD-10-CM | POA: Diagnosis not present

## 2015-04-13 DIAGNOSIS — I129 Hypertensive chronic kidney disease with stage 1 through stage 4 chronic kidney disease, or unspecified chronic kidney disease: Secondary | ICD-10-CM | POA: Insufficient documentation

## 2015-04-13 DIAGNOSIS — Z833 Family history of diabetes mellitus: Secondary | ICD-10-CM | POA: Insufficient documentation

## 2015-04-13 DIAGNOSIS — Z79899 Other long term (current) drug therapy: Secondary | ICD-10-CM | POA: Diagnosis not present

## 2015-04-13 DIAGNOSIS — Q2733 Arteriovenous malformation of digestive system vessel: Secondary | ICD-10-CM | POA: Insufficient documentation

## 2015-04-13 DIAGNOSIS — E7800 Pure hypercholesterolemia, unspecified: Secondary | ICD-10-CM | POA: Diagnosis not present

## 2015-04-13 DIAGNOSIS — Z885 Allergy status to narcotic agent status: Secondary | ICD-10-CM | POA: Insufficient documentation

## 2015-04-13 HISTORY — PX: COLONOSCOPY WITH PROPOFOL: SHX5780

## 2015-04-13 HISTORY — DX: Chronic kidney disease, unspecified: N18.9

## 2015-04-13 HISTORY — DX: Sleep apnea, unspecified: G47.30

## 2015-04-13 LAB — GLUCOSE, CAPILLARY: GLUCOSE-CAPILLARY: 108 mg/dL — AB (ref 65–99)

## 2015-04-13 SURGERY — COLONOSCOPY WITH PROPOFOL
Anesthesia: General

## 2015-04-13 MED ORDER — MIDAZOLAM HCL 2 MG/2ML IJ SOLN
INTRAMUSCULAR | Status: DC | PRN
  Administered 2015-04-13: 1 mg via INTRAVENOUS

## 2015-04-13 MED ORDER — PROPOFOL 10 MG/ML IV BOLUS
INTRAVENOUS | Status: DC | PRN
  Administered 2015-04-13: 20 mg via INTRAVENOUS
  Administered 2015-04-13: 30 mg via INTRAVENOUS
  Administered 2015-04-13 (×2): 20 mg via INTRAVENOUS

## 2015-04-13 MED ORDER — PROPOFOL 500 MG/50ML IV EMUL
INTRAVENOUS | Status: DC | PRN
  Administered 2015-04-13: 80 ug/kg/min via INTRAVENOUS

## 2015-04-13 MED ORDER — SODIUM CHLORIDE 0.9 % IV SOLN
INTRAVENOUS | Status: DC
  Administered 2015-04-13: 1000 mL via INTRAVENOUS

## 2015-04-13 MED ORDER — FENTANYL CITRATE (PF) 100 MCG/2ML IJ SOLN
INTRAMUSCULAR | Status: DC | PRN
  Administered 2015-04-13: 50 ug via INTRAVENOUS

## 2015-04-13 MED ORDER — SODIUM CHLORIDE 0.9 % IV SOLN
INTRAVENOUS | Status: DC
  Administered 2015-04-13: 11:00:00 via INTRAVENOUS

## 2015-04-13 NOTE — Op Note (Signed)
[facility] Gastroenterology Patient Name: Lindsey Caldwell Procedure Date: 04/13/2015 11:21 AM MRN: [id number] Account #: [id number] Date of Birth: 1955/07/14 Admit Type: Outpatient Age: 60 Room: [facility] ENDO ROOM 4 Gender: Female Note Status: Finalized Procedure:            Colonoscopy Indications:          High risk colon cancer surveillance: Personal history                        of colonic polyps Providers:            Manya Silvas, MD Referring MD:         Eduard Clos. Gilford Rile, MD (Referring MD) Medicines:            Propofol per Anesthesia Complications:        No immediate complications. Procedure:            Pre-Anesthesia Assessment:                       - After reviewing the risks and benefits, the patient                        was deemed in satisfactory condition to undergo the                        procedure.                       After obtaining informed consent, the colonoscope was                        passed under direct vision. Throughout the procedure,                        the patient's blood pressure, pulse, and oxygen                        saturations were monitored continuously. The                        Colonoscope was introduced through the anus and                        advanced to the the cecum, identified by appendiceal                        orifice and ileocecal valve. The colonoscopy was                        performed without difficulty. The patient tolerated the                        procedure well. The quality of the bowel preparation                        was good. Findings:      Four sessile polyps were found in the transverse colon. The polyps were       diminutive in size. These polyps were removed with a jumbo cold forceps.       Resection and retrieval were complete.      Internal hemorrhoids  were found during endoscopy. The hemorrhoids were       small and Grade I (internal hemorrhoids that do not prolapse).   The exam was otherwise without abnormality. Impression:           - Four diminutive polyps in the transverse colon,                        removed with a jumbo cold forceps. Resected and                        retrieved.                       - Internal hemorrhoids.                       - The examination was otherwise normal. Recommendation:       - Await pathology results. Manya Silvas, MD 04/13/2015 11:57:14 AM This report has been signed electronically. Number of Addenda: 0 Note Initiated On: 04/13/2015 11:21 AM Scope Withdrawal Time: 0 hours 14 minutes 5 seconds  Total Procedure Duration: 0 hours 24 minutes 7 seconds       [facility]

## 2015-04-13 NOTE — Anesthesia Postprocedure Evaluation (Addendum)
Anesthesia Post Note  Patient: Lindsey Caldwell  Procedure(s) Performed: Procedure(s) (LRB): COLONOSCOPY WITH PROPOFOL (N/A)  Patient location during evaluation: Endoscopy Anesthesia Type: General Level of consciousness: awake and alert Pain management: pain level controlled Vital Signs Assessment: post-procedure vital signs reviewed and stable Respiratory status: spontaneous breathing, nonlabored ventilation, respiratory function stable and patient connected to nasal cannula oxygen Cardiovascular status: blood pressure returned to baseline and stable Postop Assessment: no signs of nausea or vomiting Anesthetic complications: no    Last Vitals:  Filed Vitals:   04/13/15 1225 04/13/15 1229  BP: 142/69 101/81  Pulse: 81 80  Temp:    Resp: 14 24    Last Pain: There were no vitals filed for this visit.               Martha Clan

## 2015-04-13 NOTE — H&P (Signed)
Primary Care Physician:  Rica Mast, MD Primary Gastroenterologist:  Dr. Vira Agar  Pre-Procedure History & Physical: HPI:  Lindsey Caldwell is a 60 y.o. female is here for an colonoscopy.   Past Medical History  Diagnosis Date  . Diabetes mellitus without complication ([place])   . Hypertension   . Anxiety   . High cholesterol   . Cholestanol storage disease   . GERD (gastroesophageal reflux disease)   . Sleep apnea   . Chronic kidney disease     Past Surgical History  Procedure Laterality Date  . Back surgery  07/12/10    Dr. Arnoldo Morale  . Knee surgery  08/01/12  . Tubal ligation      x2    Prior to Admission medications   Medication Sig Start Date End Date Taking? Authorizing Provider  ALPRAZolam Duanne Moron) 0.5 MG tablet Take 1 tablet (0.5 mg total) by mouth at bedtime as needed for anxiety. 01/04/15   Jackolyn Confer, MD  amLODipine (NORVASC) 5 MG tablet Take 1 tablet (5 mg total) by mouth daily. 06/21/14   Crecencio Mc, MD  cyclobenzaprine (FLEXERIL) 10 MG tablet Take 10 mg by mouth 3 (three) times daily as needed for muscle spasms.    Historical Provider, MD  DEXILANT 60 MG capsule TAKE ONE (1) CAPSULE EACH DAY 04/06/15   Jackolyn Confer, MD  irbesartan (AVAPRO) 150 MG tablet TAKE ONE (1) TABLET BY MOUTH EVERY DAY 01/31/15   Jackolyn Confer, MD  lovastatin (MEVACOR) 20 MG tablet TAKE ONE TABLET BY MOUTH EVERY NIGHT AT BEDTIME 10/21/14   Jackolyn Confer, MD  montelukast (SINGULAIR) 10 MG tablet TAKE ONE TABLET BY MOUTH EVERY NIGHT AT BEDTIME 10/21/14   Jackolyn Confer, MD    Allergies as of 04/07/2015 - Review Complete 03/04/2015  Allergen Reaction Noted  . Gadolinium derivatives Hives 04/03/2011  . Codeine Nausea And Vomiting 02/13/2013  . Percocet [oxycodone-acetaminophen] Nausea And Vomiting 02/13/2013    Family History  Problem Relation Age of Onset  . Cancer Father     Hodgkins  . Diabetes Sister   . Cancer Sister     brain tumor  . Cancer Sister     breast  . Breast cancer Sister 51    Social History   Social History  . Marital Status: Married    Spouse Name: N/A  . Number of Children: N/A  . Years of Education: N/A   Occupational History  . Not on file.   Social History Main Topics  . Smoking status: Former Research scientist (life sciences)  . Smokeless tobacco: Never Used     Comment: quit 16 years ago   . Alcohol Use: No  . Drug Use: No  . Sexual Activity: Not on file   Other Topics Concern  . Not on file   Social History Narrative   Lives in [place] with husband and granddaughter. Has 2 sons. One lives in [place] and one in [place]. No pets.      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 Neck:  Supple; no masses or thyromegaly. Lungs:  Clear throughout to auscultation.  Heart:  Regular rate and rhythm. Abdomen:  Soft, nontender and nondistended. Normal bowel sounds, without guarding, and without rebound.   Neurologic:  Alert and  oriented x4;  grossly normal neurologically.  Impression/Plan: Lindsey Caldwell is here for an colonoscopy to be performed for [facility] colon polyps  Risks, benefits, limitations, and alternatives regarding  colonoscopy have been reviewed with the patient.  Questions have been answered.  All parties agreeable.   Gaylyn Cheers, MD  04/13/2015, 11:19 AM

## 2015-04-13 NOTE — Anesthesia Preprocedure Evaluation (Signed)
Anesthesia Evaluation  Patient identified by MRN, date of birth, ID band Patient awake    Reviewed: Allergy & Precautions, H&P , NPO status , Patient's Chart, lab work & pertinent test results, reviewed documented beta blocker date and time   History of Anesthesia Complications Negative for: history of anesthetic complications  Airway Mallampati: III  TM Distance: >3 FB Neck ROM: full    Dental no notable dental hx. (+) Caps   Pulmonary neg shortness of breath, sleep apnea and Continuous Positive Airway Pressure Ventilation , neg COPD, neg recent URI, former smoker,    Pulmonary exam normal breath sounds clear to auscultation       Cardiovascular Exercise Tolerance: Good hypertension, (-) angina(-) CAD, (-) Past MI, (-) Cardiac Stents and (-) CABG Normal cardiovascular exam(-) dysrhythmias (-) Valvular Problems/Murmurs Rhythm:regular Rate:Normal     Neuro/Psych negative neurological ROS  negative psych ROS   GI/Hepatic Neg liver ROS, GERD  ,  Endo/Other  diabetesMorbid obesity  Renal/GU CRFRenal disease  negative genitourinary   Musculoskeletal   Abdominal   Peds  Hematology negative hematology ROS (+)   Anesthesia Other Findings Past Medical History:   Diabetes mellitus without complication (HCC)                 Hypertension                                                 Anxiety                                                      High cholesterol                                             Cholestanol storage disease                                  GERD (gastroesophageal reflux disease)                       Sleep apnea                                                  Chronic kidney disease                                       Reproductive/Obstetrics negative OB ROS                             Anesthesia Physical Anesthesia Plan  ASA: III  Anesthesia Plan: General   Post-op  Pain Management:    Induction:   Airway Management Planned:   Additional Equipment:   Intra-op Plan:   Post-operative Plan:   Informed Consent: I have reviewed  the patients History and Physical, chart, labs and discussed the procedure including the risks, benefits and alternatives for the proposed anesthesia with the patient or authorized representative who has indicated his/her understanding and acceptance.   Dental Advisory Given  Plan Discussed with: Anesthesiologist, CRNA and Surgeon  Anesthesia Plan Comments:         Anesthesia Quick Evaluation

## 2015-04-13 NOTE — Transfer of Care (Signed)
Immediate Anesthesia Transfer of Care Note  Patient: Lindsey Caldwell  Procedure(s) Performed: Procedure(s): COLONOSCOPY WITH PROPOFOL (N/A)  Patient Location: PACU  Anesthesia Type:General  Level of Consciousness: awake, alert  and oriented  Airway & Oxygen Therapy: Patient Spontanous Breathing and Patient connected to nasal cannula oxygen  Post-op Assessment: Report given to RN and Post -op Vital signs reviewed and stable  Post vital signs: Reviewed and stable  Last Vitals:  Filed Vitals:   04/13/15 1017  BP: 168/65  Pulse: 79  Temp: 36.4 C  Resp: 20    Complications: No apparent anesthesia complications

## 2015-04-14 LAB — SURGICAL PATHOLOGY

## 2015-04-15 ENCOUNTER — Encounter: Payer: Self-pay | Admitting: Physician Assistant

## 2015-04-15 ENCOUNTER — Ambulatory Visit: Payer: Self-pay | Admitting: Physician Assistant

## 2015-04-15 VITALS — BP 130/80 | HR 88 | Temp 98.6°F

## 2015-04-15 DIAGNOSIS — R7309 Other abnormal glucose: Secondary | ICD-10-CM

## 2015-04-15 DIAGNOSIS — J069 Acute upper respiratory infection, unspecified: Secondary | ICD-10-CM

## 2015-04-15 LAB — GLUCOSE, POCT (MANUAL RESULT ENTRY): POC Glucose: 117 mg/dl — AB (ref 70–99)

## 2015-04-15 MED ORDER — AZITHROMYCIN 250 MG PO TABS: ORAL_TABLET | ORAL | Status: DC

## 2015-04-15 NOTE — Progress Notes (Signed)
S: c/o cough for 3 weeks, is dry and hacking, no fever/chills/body aches, taking otc meds without relief, also glucose levels have been running high on her machine, getting 180 a lot, not sure how old the strips are  O: vitals wnl, nad, ENT wnl, neck supple no lymph, lungs c t a, cv rrr, cough is dry, fsbs 118  A: acute uri, elevated home glucose levels  P: zpack, test strips, check glucose with new strips as others may be outdated and giving incorrect readings, if still elevated f/u with pcp

## 2015-04-16 LAB — HGB A1C W/O EAG: HEMOGLOBIN A1C: 6 % — AB (ref 4.8–5.6)

## 2015-04-16 LAB — THYROID PANEL WITH TSH
FREE THYROXINE INDEX: 1.7 (ref 1.2–4.9)
T3 Uptake Ratio: 25 % (ref 24–39)
T4 TOTAL: 6.7 ug/dL (ref 4.5–12.0)
TSH: 1.28 u[IU]/mL (ref 0.450–4.500)

## 2015-04-16 LAB — BASIC METABOLIC PANEL
BUN / CREAT RATIO: 13 (ref 11–26)
BUN: 13 mg/dL (ref 8–27)
CO2: 24 mmol/L (ref 18–29)
CREATININE: 0.99 mg/dL (ref 0.57–1.00)
Calcium: 9.8 mg/dL (ref 8.7–10.3)
Chloride: 102 mmol/L (ref 96–106)
GFR calc Af Amer: 72 mL/min/{1.73_m2} (ref >59)
GFR, EST NON AFRICAN AMERICAN: 62 mL/min/{1.73_m2} (ref >59)
GLUCOSE: 108 mg/dL — AB (ref 65–99)
Potassium: 3.7 mmol/L (ref 3.5–5.2)
SODIUM: 141 mmol/L (ref 134–144)

## 2015-04-21 ENCOUNTER — Encounter: Payer: Self-pay | Admitting: Obstetrics and Gynecology

## 2015-04-21 NOTE — Progress Notes (Signed)
I spoke to the patient about her lab results and she expressed understanding.

## 2015-04-25 ENCOUNTER — Ambulatory Visit: Payer: Self-pay | Admitting: Internal Medicine

## 2015-04-26 LAB — HM DIABETES EYE EXAM

## 2015-04-27 ENCOUNTER — Encounter: Payer: Self-pay | Admitting: *Deleted

## 2015-05-02 ENCOUNTER — Ambulatory Visit: Payer: Self-pay | Admitting: Internal Medicine

## 2015-05-18 ENCOUNTER — Encounter: Payer: Self-pay | Admitting: Obstetrics and Gynecology

## 2015-05-23 ENCOUNTER — Ambulatory Visit: Payer: Self-pay | Admitting: Physician Assistant

## 2015-05-23 ENCOUNTER — Encounter: Payer: Self-pay | Admitting: Physician Assistant

## 2015-05-23 VITALS — BP 130/89 | HR 88 | Temp 98.4°F

## 2015-05-23 DIAGNOSIS — H8110 Benign paroxysmal vertigo, unspecified ear: Secondary | ICD-10-CM

## 2015-05-23 MED ORDER — MECLIZINE HCL 25 MG PO TABS: 25.0000 mg | ORAL_TABLET | Freq: Three times a day (TID) | ORAL | Status: DC | PRN

## 2015-05-23 MED ORDER — FLUTICASONE PROPIONATE 50 MCG/ACT NA SUSP: 2.0000 | Freq: Every day | NASAL | Status: DC

## 2015-05-23 NOTE — Progress Notes (Signed)
S: c/o dizziness, when she lies down she feels like the bed is spinning, has to rise to sitting position slowly due to dizziness, ears are itching and popping, denies cp/sob/v/d; hx of vertigo and feels the same  O: vitals wnl, nad, perrl eomi, no nystagmus noted, tms dull b/l, throat wnl, neck supple no lymph, lungs c t a, cv rrr  A: vertigo  P: antivert 25mg , flonase, return if worsening , f/u with pcp prn

## 2015-05-25 ENCOUNTER — Other Ambulatory Visit: Payer: Self-pay | Admitting: Physician Assistant

## 2015-06-07 ENCOUNTER — Other Ambulatory Visit: Payer: Self-pay | Admitting: Internal Medicine

## 2015-06-21 ENCOUNTER — Encounter: Payer: Self-pay | Admitting: Obstetrics and Gynecology

## 2015-06-21 ENCOUNTER — Ambulatory Visit (INDEPENDENT_AMBULATORY_CARE_PROVIDER_SITE_OTHER): Payer: Managed Care, Other (non HMO) | Admitting: Obstetrics and Gynecology

## 2015-06-21 VITALS — BP 143/75 | HR 91 | Ht 64.0 in | Wt 251.1 lb

## 2015-06-21 DIAGNOSIS — N882 Stricture and stenosis of cervix uteri: Secondary | ICD-10-CM | POA: Diagnosis not present

## 2015-06-21 DIAGNOSIS — D259 Leiomyoma of uterus, unspecified: Secondary | ICD-10-CM | POA: Diagnosis not present

## 2015-06-21 DIAGNOSIS — R9389 Abnormal findings on diagnostic imaging of other specified body structures: Secondary | ICD-10-CM | POA: Insufficient documentation

## 2015-06-21 DIAGNOSIS — N950 Postmenopausal bleeding: Secondary | ICD-10-CM

## 2015-06-21 DIAGNOSIS — N930 Postcoital and contact bleeding: Secondary | ICD-10-CM

## 2015-06-21 DIAGNOSIS — R938 Abnormal findings on diagnostic imaging of other specified body structures: Secondary | ICD-10-CM

## 2015-06-21 NOTE — Patient Instructions (Signed)
1. Endometrial biopsy was attempted today, but discontinued due to cervical stenosis 2. Hysteroscopy/D&C will be scheduled to assess abnormal bleeding

## 2015-06-21 NOTE — Progress Notes (Signed)
GYN ENCOUNTER NOTE  Subjective:       Lindsey Caldwell is a 60 y.o. [id number] female is here for gynecologic evaluation of the following issues:  1. Referral from PCP for post-menopausal, post-coital bleeding. Patient states this happened once. Denies any dysparunia, other bleeding, pelvic pain, urinary or bowel symptoms. [place] was ordered by PCP.   Gynecologic History No LMP recorded. Patient is postmenopausal. Contraception: post menopausal status   Obstetric History OB History  Gravida Para Term Preterm AB SAB TAB Ectopic Multiple Living  4 1 1  2   2  2     # Outcome Date GA Lbr Len/2nd Weight Sex Delivery Anes PTL Lv  4 Gravida 1975    M Vag-Spont   Y  3 Term 1970    M Vag-Spont   Y  2 Ectopic           1 Ectopic               Past Medical History  Diagnosis Date  . Diabetes mellitus without complication ([place])   . Hypertension   . Anxiety   . High cholesterol   . Cholestanol storage disease   . GERD (gastroesophageal reflux disease)   . Sleep apnea   . Chronic kidney disease     Past Surgical History  Procedure Laterality Date  . Back surgery  07/12/10    Dr. Arnoldo Morale  . Knee surgery  08/01/12  . Tubal ligation      x2  . Colonoscopy with propofol N/A 04/13/2015    Procedure: COLONOSCOPY WITH PROPOFOL;  Surgeon: Manya Silvas, MD;  Location: [facility] ENDOSCOPY;  Service: Endoscopy;  Laterality: N/A;    Current Outpatient Prescriptions on File Prior to Visit  Medication Sig Dispense Refill  . amLODipine (NORVASC) 5 MG tablet Take 1 tablet (5 mg total) by mouth daily. 90 tablet 3  . cyclobenzaprine (FLEXERIL) 10 MG tablet Take 10 mg by mouth 3 (three) times daily as needed for muscle spasms.    . DEXILANT 60 MG capsule TAKE ONE (1) CAPSULE EACH DAY 30 capsule 5  . irbesartan (AVAPRO) 150 MG tablet TAKE ONE (1) TABLET BY MOUTH EVERY DAY 30 tablet 11  . lovastatin (MEVACOR) 20 MG tablet TAKE ONE TABLET DAILY AT BEDTIME 90 tablet 2  . montelukast (SINGULAIR) 10 MG tablet  TAKE ONE TABLET DAILY AT BEDTIME 90 tablet 2   No current facility-administered medications on file prior to visit.    Allergies  Allergen Reactions  . Gadolinium Derivatives Hives    Hives on chest/redness/itching after 19 cc multihance-benadryl was given per radiologist Hives on chest/redness/itching after 19 cc multihance-benadryl was given per radiologist  . Codeine Nausea And Vomiting  . Percocet [Oxycodone-Acetaminophen] Nausea And Vomiting    Social History   Social History  . Marital Status: Married    Spouse Name: N/A  . Number of Children: N/A  . Years of Education: N/A   Occupational History  . Not on file.   Social History Main Topics  . Smoking status: Former Research scientist (life sciences)  . Smokeless tobacco: Never Used     Comment: quit 16 years ago   . Alcohol Use: No  . Drug Use: No  . Sexual Activity: Yes    Birth Control/ Protection: Post-menopausal   Other Topics Concern  . Not on file   Social History Narrative   Lives in [place] with husband and granddaughter. Has 2 sons. One lives in [place] and one in [place]. No pets.  Work - Management consultant as a guard, alternating days and night    Family History  Problem Relation Age of Onset  . Cancer Father     Hodgkins  . Diabetes Sister   . Cancer Sister     brain tumor  . Cancer Sister     breast  . Breast cancer Sister 71  . Colon cancer Neg Hx   . Ovarian cancer Neg Hx     The following portions of the patient's history were reviewed and updated as appropriate: allergies, current medications, past family history, past medical history, past social history, past surgical history and problem list.  Review of Systems Review of Systems - Negative except as mentioned in HPI Review of Systems - General ROS: negative for - chills, fatigue, fever, hot flashes, malaise or night sweats Hematological and Lymphatic ROS: negative for - bleeding problems or swollen lymph nodes Gastrointestinal ROS: negative for  - abdominal pain, blood in stools, change in bowel habits and nausea/vomiting Musculoskeletal ROS: negative for - joint pain, muscle pain or muscular weakness Genito-Urinary ROS: negative for - dyspareunia, dysuria, genital discharge, genital ulcers, hematuria, incontinence, nocturia or pelvic pain  Objective:   BP 143/75 mmHg  Pulse 91  Ht 5\' 4"  (1.626 m)  Wt 251 lb 1.6 oz (113.898 kg)  BMI 43.08 kg/m2 CONSTITUTIONAL: Well-developed, morbidly obese female in no acute distress.  HENT:  Normocephalic, atraumatic.  NECK: Normal range of motion, supple, no masses.  SKIN: Skin is warm and dry. No rash noted. Not diaphoretic. No erythema. No pallor. [place]: Alert and oriented to person, place, and time.  PSYCHIATRIC: Normal mood and affect. Normal behavior. Normal judgment and thought content. CARDIOVASCULAR:Not Examined RESPIRATORY: Not Examined BREASTS: Not Examined ABDOMEN: Soft, non distended; Non tender.  No Organomegaly. PELVIC:  External Genitalia: Normal  BUS: Normal  Vagina: Normal  Cervix: Stenotic  Uterus: Normal size, midplane, shape, mobile  Adnexa: Normal  RV: Not examined.  Bladder: Nontender MUSCULOSKELETAL: Normal range of motion. No tenderness.  No clubbing, or edema.  [place] results reviewed with patient: Multiple uterine fibroids, irregular and thickened endometrium, and possible endometrial polyp.  Endometrial Biopsy Procedure Note  Pre-operative Diagnosis: Postmenopausal bleeding; postcoital bleeding  Post-operative Diagnosis: same  Indications: postmenopausal bleeding  Procedure Details   Urine pregnancy test was not done.  The risks (including infection, bleeding, pain, and uterine perforation) and benefits of the procedure were explained to the patient and Verbal informed consent was obtained.  Antibiotic prophylaxis against endocarditis was not indicated.   The patient was placed in the dorsal lithotomy position.  Bimanual exam showed the uterus to be  in the neutral position.  A Graves' speculum inserted in the vagina..  Endocervical curettage with a Kevorkian curette was not performed.   A sharp tenaculum was applied to the anterior lip of the cervix for stabilization.  A sterile uterine sound was unable to be passed through the endocervical canal due to cervical stenosis  A Mylex 63mm curette was unable to sample the endometrium.    Condition: Stable  Complications: None  Plan:  The patient was advised to call for any fever or for prolonged or severe pain or bleeding. She was advised to use OTC acetaminophen and OTC ibuprofen as needed for mild to moderate pain. She was advised to avoid vaginal intercourse for 48 hours or until the bleeding has completely stopped.  Attending Physician Documentation: Brayton Mars, MD     Assessment:   1. Postmenopausal bleeding - [place]  already done - multiple uterine fibroids, endometrial thickening, possible endometrial polyp - endometrial biopsy attempted but limited by cervical stenosis  2. Postcoital bleeding  3. Uterine leiomyoma, unspecified location  4. Thickened endometrium  5. Cervical stenosis (uterine cervix)  6. Morbid obesity    Plan:   1. Endometrial biopsy - discontinued due to cervical stenosis. 2. Schedule hysteroscopy and D&C to assess US findings and post-coital bleeding. 3. Follow-up 1 wk prior to scheduled procedure for pre-op visit.  Claiborne Billings Rayburn PA-S Brayton Mars, MD   I have seen, interviewed, and examined the patient in conjunction with the [facility].A. student and affirm the diagnosis and management plan. Yarielis Funaro A. Feliz Herard, MD, FACOG   Note: This dictation was prepared with Dragon dictation along with smaller phrase technology. Any transcriptional errors that result from this process are unintentional.

## 2015-06-24 ENCOUNTER — Ambulatory Visit: Payer: Self-pay | Admitting: Physician Assistant

## 2015-06-24 ENCOUNTER — Encounter: Payer: Self-pay | Admitting: Physician Assistant

## 2015-06-24 VITALS — BP 140/80 | HR 105 | Temp 98.5°F

## 2015-06-24 DIAGNOSIS — J069 Acute upper respiratory infection, unspecified: Secondary | ICD-10-CM

## 2015-06-24 MED ORDER — AZITHROMYCIN 250 MG PO TABS: ORAL_TABLET | ORAL | Status: DC

## 2015-06-24 MED ORDER — HYDROCOD POLST-CPM POLST ER 10-8 MG/5ML PO SUER: 5.0000 mL | Freq: Two times a day (BID) | ORAL | Status: DC | PRN

## 2015-06-24 NOTE — Progress Notes (Signed)
S: C/o runny nose and congestion with dry cough for 3 days, no fever, chills, cp/sob, v/d; mucus was green this am , cough is sporadic, husband had same sx, denies body aches  O: PE: vitals wnl, nad, perrl eomi, normocephalic, tms dull, nasal mucosa red and swollen, throat injected, neck supple no lymph, lungs c t a, cv rrr, neuro intact, cough is dry  A:  Acute uri   P: zpack, tussionex, drink fluids, continue regular meds , use otc meds of choice, return if not improving in 5 days, return earlier if worsening

## 2015-06-28 ENCOUNTER — Telehealth: Payer: Self-pay | Admitting: Obstetrics and Gynecology

## 2015-06-28 NOTE — Telephone Encounter (Signed)
Patient needs a note stating that she is scheduled for surgery on 07/11/15, how long she will need to remain out of work and when she can return. Thanks

## 2015-06-29 NOTE — Telephone Encounter (Signed)
Pt aware letter left up front for p/u. 

## 2015-07-07 ENCOUNTER — Encounter: Payer: Self-pay | Admitting: Obstetrics and Gynecology

## 2015-07-07 ENCOUNTER — Ambulatory Visit (INDEPENDENT_AMBULATORY_CARE_PROVIDER_SITE_OTHER): Payer: Managed Care, Other (non HMO) | Admitting: Obstetrics and Gynecology

## 2015-07-07 ENCOUNTER — Encounter
Admission: RE | Admit: 2015-07-07 | Discharge: 2015-07-07 | Disposition: A | Discharge status: Home / Self Care | Payer: Managed Care, Other (non HMO) | Source: Ambulatory Visit | Attending: Obstetrics and Gynecology | Admitting: Obstetrics and Gynecology

## 2015-07-07 VITALS — BP 138/75 | HR 88 | Ht 64.0 in | Wt 253.0 lb

## 2015-07-07 DIAGNOSIS — R938 Abnormal findings on diagnostic imaging of other specified body structures: Secondary | ICD-10-CM

## 2015-07-07 DIAGNOSIS — Z01812 Encounter for preprocedural laboratory examination: Secondary | ICD-10-CM | POA: Diagnosis not present

## 2015-07-07 DIAGNOSIS — N950 Postmenopausal bleeding: Secondary | ICD-10-CM

## 2015-07-07 DIAGNOSIS — N882 Stricture and stenosis of cervix uteri: Secondary | ICD-10-CM

## 2015-07-07 DIAGNOSIS — Z01810 Encounter for preprocedural cardiovascular examination: Secondary | ICD-10-CM | POA: Insufficient documentation

## 2015-07-07 DIAGNOSIS — Z01818 Encounter for other preprocedural examination: Secondary | ICD-10-CM

## 2015-07-07 DIAGNOSIS — D259 Leiomyoma of uterus, unspecified: Secondary | ICD-10-CM

## 2015-07-07 DIAGNOSIS — N930 Postcoital and contact bleeding: Secondary | ICD-10-CM

## 2015-07-07 DIAGNOSIS — R9389 Abnormal findings on diagnostic imaging of other specified body structures: Secondary | ICD-10-CM

## 2015-07-07 LAB — CBC WITH DIFFERENTIAL/PLATELET
Basophils Absolute: 0.1 10*3/uL (ref 0–0.1)
Basophils Relative: 1 %
Eosinophils Absolute: 0.2 10*3/uL (ref 0–0.7)
HEMATOCRIT: 38.5 % (ref 35.0–47.0)
Hemoglobin: 12.3 g/dL (ref 12.0–16.0)
LYMPHS ABS: 2.3 10*3/uL (ref 1.0–3.6)
MCH: 27.2 pg (ref 26.0–34.0)
MCHC: 32 g/dL (ref 32.0–36.0)
MCV: 85.2 fL (ref 80.0–100.0)
MONO ABS: 0.7 10*3/uL (ref 0.2–0.9)
Monocytes Relative: 7 %
NEUTROS ABS: 6.9 10*3/uL — AB (ref 1.4–6.5)
Neutrophils Relative %: 67 %
PLATELETS: 220 10*3/uL (ref 150–440)
RBC: 4.51 MIL/uL (ref 3.80–5.20)
RDW: 13.9 % (ref 11.5–14.5)
WBC: 10.3 10*3/uL (ref 3.6–11.0)

## 2015-07-07 LAB — RAPID HIV SCREEN (HIV 1/2 AB+AG)
HIV 1/2 Antibodies: NONREACTIVE
HIV-1 P24 Antigen - HIV24: NONREACTIVE

## 2015-07-07 LAB — SURGICAL PCR SCREEN
MRSA, PCR: NEGATIVE
STAPHYLOCOCCUS AUREUS: NEGATIVE

## 2015-07-07 NOTE — H&P (Signed)
Subjective:  PREOPERATIVE HISTORY AND PHYSICAL     Patient is a 60 y.o. G4P1027female scheduled for Hysteroscopy/D&C due to history of postmenopausal bleeding with inability to do endometrial sampling with in the office.  Ultrasound 02/03/2015 demonstrated fibroid uterus with at least 3 fibroids (largest measuring 3.4 cm); endometrium was indistinct but appeared to be slightly thickened; endometrial fluid collection was noted measuring 1.3 cm; possible endometrial polyp measuring 5 mm was noted along this fluid collection. No adnexal masses were seen.  06/21/2015 endometrial biopsy was attempted but discontinued due to cervical stenosis.   Menstrual History: OB History    Gravida Para Term Preterm AB TAB SAB Ectopic Multiple Living   4 1 1  2   2  2       Menarche age:NA  No LMP recorded. Patient is postmenopausal.    Past Medical History  Diagnosis Date  . Diabetes mellitus without complication ([place])   . Hypertension   . Anxiety   . High cholesterol   . Cholestanol storage disease   . GERD (gastroesophageal reflux disease)   . Sleep apnea   . Chronic kidney disease     Past Surgical History  Procedure Laterality Date  . Back surgery  07/12/10    Dr. Arnoldo Morale  . Knee surgery  08/01/12  . Tubal ligation      x2  . Colonoscopy with propofol N/A 04/13/2015    Procedure: COLONOSCOPY WITH PROPOFOL;  Surgeon: Manya Silvas, MD;  Location: [facility] ENDOSCOPY;  Service: Endoscopy;  Laterality: N/A;    OB History  Gravida Para Term Preterm AB SAB TAB Ectopic Multiple Living  4 1 1  2   2  2     # Outcome Date GA Lbr Len/2nd Weight Sex Delivery Anes PTL Lv  4 Gravida 1975    M Vag-Spont   Y  3 Term 1970    M Vag-Spont   Y  2 Ectopic           1 Ectopic               Social History   Social History  . Marital Status: Married    Spouse Name: N/A  . Number of Children: N/A  . Years of Education: N/A   Social History Main Topics  . Smoking status: Former Research scientist (life sciences)  . Smokeless  tobacco: Never Used     Comment: quit 16 years ago   . Alcohol Use: No  . Drug Use: No  . Sexual Activity: Yes    Birth Control/ Protection: Post-menopausal   Other Topics Concern  . None   Social History Narrative   Lives in [place] with husband and granddaughter. Has 2 sons. One lives in [place] and one in [place]. No pets.      Work - Management consultant as a guard, alternating days and night    Family History  Problem Relation Age of Onset  . Cancer Father     Hodgkins  . Diabetes Sister   . Cancer Sister     brain tumor  . Cancer Sister     breast  . Breast cancer Sister 108  . Colon cancer Neg Hx   . Ovarian cancer Neg Hx      (Not in a hospital admission)  Allergies  Allergen Reactions  . Gadolinium Derivatives Hives    Hives on chest/redness/itching after 19 cc multihance-benadryl was given per radiologist Hives on chest/redness/itching after 19 cc multihance-benadryl was given per radiologist  . Codeine Nausea  And Vomiting  . Percocet [Oxycodone-Acetaminophen] Nausea And Vomiting    Review of Systems Constitutional: No recent fever/chills/sweats Respiratory: No recent cough/bronchitis Cardiovascular: No chest pain Gastrointestinal: No recent nausea/vomiting/diarrhea Genitourinary: No UTI symptoms Hematologic/lymphatic:No history of coagulopathy or recent blood thinner use    Objective:    BP 138/75 mmHg  Pulse 88  Ht 5\' 4"  (1.626 m)  Wt 253 lb (114.76 kg)  BMI 43.41 kg/m2  General:   Normal  Skin:   normal  HEENT:  Normal  Neck:  Supple without Adenopathy or Thyromegaly  Lungs:   Heart:              Breasts:   Abdomen:  Pelvis:  M/S   Extremeties:  Neuro:    clear to auscultation bilaterally   Normal without murmur   Not Examined   soft, non-tender; bowel sounds normal; no masses,  no organomegaly   Exam deferred to OR  No CVAT  Warm/Dry   Normal       06/21/2015 PELVIC: External Genitalia:  Normal BUS: Normal Vagina: Normal Cervix: Stenotic Uterus: Normal size, midplane, shape, mobile Adnexa: Normal RV: Not examined. Bladder: Nontender MUSCULOSKELETAL: Normal range of motion. No tenderness. No clubbing, or edema.  Assessment:  1. Postmenopausal bleeding - [place] already done - multiple uterine fibroids, endometrial thickening, possible endometrial polyp - endometrial biopsy attempted but limited by cervical stenosis  2. Postcoital bleeding  3. Uterine leiomyoma, unspecified location  4. Thickened endometrium  5. Cervical stenosis (uterine cervix)  6. Morbid obesity  Plan:  Hysteroscopy/D&C  Preoperative counseling: The patient is to undergo hysteroscopy/D&C for postmenopausal bleeding and postcoital bleeding. She is understanding of the planned procedure and is aware of and is accepting of all surgical risks which include but are not limited to bleeding, infection, pelvic organ injury with need for repair, blood clot disorders, anesthesia risks, etc. All questions have been answered. Informed consent is given. Patient is ready and willing to proceed with surgery as scheduled.  Brayton Mars, MD  Note: This dictation was prepared with Dragon dictation along with smaller phrase technology. Any transcriptional errors that result from this process are unintentional.

## 2015-07-07 NOTE — Progress Notes (Signed)
Subjective:    Patient is a 60 y.o. G4P1042female scheduled for Hysteroscopy/D&C due to history of postmenopausal bleeding with inability to do endometrial sampling with in the office.  Ultrasound 02/03/2015 demonstrated fibroid uterus with at least 3 fibroids (largest measuring 3.4 cm); endometrium was indistinct but appeared to be slightly thickened; endometrial fluid collection was noted measuring 1.3 cm; possible endometrial polyp measuring 5 mm was noted along this fluid collection. No adnexal masses were seen.  06/21/2015 endometrial biopsy was attempted but discontinued due to cervical stenosis.   Menstrual History: OB History    Gravida Para Term Preterm AB TAB SAB Ectopic Multiple Living   4 1 1  2   2  2       Menarche age:NA  No LMP recorded. Patient is postmenopausal.    Past Medical History  Diagnosis Date  . Diabetes mellitus without complication ([place])   . Hypertension   . Anxiety   . High cholesterol   . Cholestanol storage disease   . GERD (gastroesophageal reflux disease)   . Sleep apnea   . Chronic kidney disease     Past Surgical History  Procedure Laterality Date  . Back surgery  07/12/10    Dr. Arnoldo Morale  . Knee surgery  08/01/12  . Tubal ligation      x2  . Colonoscopy with propofol N/A 04/13/2015    Procedure: COLONOSCOPY WITH PROPOFOL;  Surgeon: Manya Silvas, MD;  Location: [facility] ENDOSCOPY;  Service: Endoscopy;  Laterality: N/A;    OB History  Gravida Para Term Preterm AB SAB TAB Ectopic Multiple Living  4 1 1  2   2  2     # Outcome Date GA Lbr Len/2nd Weight Sex Delivery Anes PTL Lv  4 Gravida 1975    M Vag-Spont   Y  3 Term 1970    M Vag-Spont   Y  2 Ectopic           1 Ectopic               Social History   Social History  . Marital Status: Married    Spouse Name: N/A  . Number of Children: N/A  . Years of Education: N/A   Social History Main Topics  . Smoking status: Former Research scientist (life sciences)  . Smokeless tobacco: Never Used     Comment: quit  16 years ago   . Alcohol Use: No  . Drug Use: No  . Sexual Activity: Yes    Birth Control/ Protection: Post-menopausal   Other Topics Concern  . None   Social History Narrative   Lives in [place] with husband and granddaughter. Has 2 sons. One lives in [place] and one in [place]. No pets.      Work - Management consultant as a guard, alternating days and night    Family History  Problem Relation Age of Onset  . Cancer Father     Hodgkins  . Diabetes Sister   . Cancer Sister     brain tumor  . Cancer Sister     breast  . Breast cancer Sister 13  . Colon cancer Neg Hx   . Ovarian cancer Neg Hx      (Not in a hospital admission)  Allergies  Allergen Reactions  . Gadolinium Derivatives Hives    Hives on chest/redness/itching after 19 cc multihance-benadryl was given per radiologist Hives on chest/redness/itching after 19 cc multihance-benadryl was given per radiologist  . Codeine Nausea And Vomiting  . Percocet [Oxycodone-Acetaminophen]  Nausea And Vomiting    Review of Systems Constitutional: No recent fever/chills/sweats Respiratory: No recent cough/bronchitis Cardiovascular: No chest pain Gastrointestinal: No recent nausea/vomiting/diarrhea Genitourinary: No UTI symptoms Hematologic/lymphatic:No history of coagulopathy or recent blood thinner use    Objective:    BP 138/75 mmHg  Pulse 88  Ht 5\' 4"  (1.626 m)  Wt 253 lb (114.76 kg)  BMI 43.41 kg/m2  General:   Normal  Skin:   normal  HEENT:  Normal  Neck:  Supple without Adenopathy or Thyromegaly  Lungs:   Heart:              Breasts:   Abdomen:  Pelvis:  M/S   Extremeties:  Neuro:    clear to auscultation bilaterally   Normal without murmur   Not Examined   soft, non-tender; bowel sounds normal; no masses,  no organomegaly   Exam deferred to OR  No CVAT  Warm/Dry   Normal       06/21/2015 PELVIC: External Genitalia: Normal BUS: Normal Vagina:  Normal Cervix: Stenotic Uterus: Normal size, midplane, shape, mobile Adnexa: Normal RV: Not examined. Bladder: Nontender MUSCULOSKELETAL: Normal range of motion. No tenderness. No clubbing, or edema.  Assessment:  1. Postmenopausal bleeding - [place] already done - multiple uterine fibroids, endometrial thickening, possible endometrial polyp - endometrial biopsy attempted but limited by cervical stenosis  2. Postcoital bleeding  3. Uterine leiomyoma, unspecified location  4. Thickened endometrium  5. Cervical stenosis (uterine cervix)  6. Morbid obesity  Plan:  Hysteroscopy/D&C  Preoperative counseling: The patient is to undergo hysteroscopy/D&C for postmenopausal bleeding and postcoital bleeding. She is understanding of the planned procedure and is aware of and is accepting of all surgical risks which include but are not limited to bleeding, infection, pelvic organ injury with need for repair, blood clot disorders, anesthesia risks, etc. All questions have been answered. Informed consent is given. Patient is ready and willing to proceed with surgery as scheduled.  Brayton Mars, MD  Note: This dictation was prepared with Dragon dictation along with smaller phrase technology. Any transcriptional errors that result from this process are unintentional.

## 2015-07-07 NOTE — Patient Instructions (Signed)
  Your procedure is scheduled on: Jul 11, 2015 (Monday) Report to Same Day Surgery 2nd floor Medical Mall To find out your arrival time please call [phone] between 1PM - 3PM on Jul 08, 2015 (Friday)  Remember: Instructions that are not followed completely may result in serious medical risk, up to and including death, or upon the discretion of your surgeon and anesthesiologist your surgery may need to be rescheduled.    _x___ 1. Do not eat food or drink liquids after midnight. No gum chewing or hard candies.     _x_ 2. No Alcohol for 24 hours before or after surgery.   ____ 3. Bring all medications with you on the day of surgery if instructed.    __x__ 4. Notify your doctor if there is any change in your medical condition     (cold, fever, infections).     Do not wear jewelry, make-up, hairpins, clips or nail polish.  Do not wear lotions, powders, or perfumes. You may wear deodorant.  Do not shave 48 hours prior to surgery. Men may shave face and neck.  Do not bring valuables to the hospital.    [facility] is not responsible for any belongings or valuables.               Contacts, dentures or bridgework may not be worn into surgery.  Leave your suitcase in the car. After surgery it may be brought to your room.  For patients admitted to the hospital, discharge time is determined by your treatment team.   Patients discharged the day of surgery will not be allowed to drive home.    Please read over the following fact sheets that you were given:   [facility] Preparing for Surgery and or MRSA Information   _x___ Take these medicines the morning of surgery with A SIP OF WATER:    1. Dexilant  2.Amlodipine  3.Irbesartan  4.Montelukast  5.  6.  ____ Fleet Enema (as directed)   _x___ Use CHG Soap or sage wipes as directed on instruction sheet   ____ Use inhalers on the day of surgery and bring to hospital day of surgery  ____ Stop metformin 2 days prior to  surgery    ____ Take 1/2 of usual insulin dose the night before surgery and none on the morning of surgery            _x___ Stop aspirin or coumadin, or plavix (N/A)  _x__ Stop Anti-inflammatories such as Advil, Aleve, Ibuprofen, Motrin, Naproxen,          Naprosyn, Goodies powders or aspirin products. Ok to take Tylenol.   ____ Stop supplements until after surgery.    _x__ Bring C-Pap to the hospital.

## 2015-07-07 NOTE — Patient Instructions (Signed)
1. Return on 07/19/2015 for 1 week postop check

## 2015-07-08 ENCOUNTER — Other Ambulatory Visit: Payer: Self-pay | Admitting: Internal Medicine

## 2015-07-09 LAB — RPR: RPR: NONREACTIVE

## 2015-07-11 ENCOUNTER — Encounter: Payer: Self-pay | Admitting: *Deleted

## 2015-07-11 ENCOUNTER — Encounter
Admission: RE | Disposition: A | Discharge status: Home / Self Care | Payer: Self-pay | Source: Ambulatory Visit | Attending: Obstetrics and Gynecology

## 2015-07-11 ENCOUNTER — Ambulatory Visit
Admission: RE | Admit: 2015-07-11 | Discharge: 2015-07-11 | Disposition: A | Discharge status: Home / Self Care | Payer: Managed Care, Other (non HMO) | Source: Ambulatory Visit | Attending: Obstetrics and Gynecology | Admitting: Obstetrics and Gynecology

## 2015-07-11 ENCOUNTER — Ambulatory Visit: Payer: Managed Care, Other (non HMO) | Admitting: Anesthesiology

## 2015-07-11 DIAGNOSIS — K219 Gastro-esophageal reflux disease without esophagitis: Secondary | ICD-10-CM | POA: Diagnosis not present

## 2015-07-11 DIAGNOSIS — Z888 Allergy status to other drugs, medicaments and biological substances status: Secondary | ICD-10-CM | POA: Insufficient documentation

## 2015-07-11 DIAGNOSIS — E7800 Pure hypercholesterolemia, unspecified: Secondary | ICD-10-CM | POA: Insufficient documentation

## 2015-07-11 DIAGNOSIS — Z885 Allergy status to narcotic agent status: Secondary | ICD-10-CM | POA: Insufficient documentation

## 2015-07-11 DIAGNOSIS — D259 Leiomyoma of uterus, unspecified: Secondary | ICD-10-CM

## 2015-07-11 DIAGNOSIS — N930 Postcoital and contact bleeding: Secondary | ICD-10-CM | POA: Diagnosis not present

## 2015-07-11 DIAGNOSIS — Z7984 Long term (current) use of oral hypoglycemic drugs: Secondary | ICD-10-CM | POA: Diagnosis not present

## 2015-07-11 DIAGNOSIS — R9389 Abnormal findings on diagnostic imaging of other specified body structures: Secondary | ICD-10-CM

## 2015-07-11 DIAGNOSIS — G4730 Sleep apnea, unspecified: Secondary | ICD-10-CM | POA: Diagnosis not present

## 2015-07-11 DIAGNOSIS — Z9851 Tubal ligation status: Secondary | ICD-10-CM | POA: Insufficient documentation

## 2015-07-11 DIAGNOSIS — N840 Polyp of corpus uteri: Secondary | ICD-10-CM | POA: Insufficient documentation

## 2015-07-11 DIAGNOSIS — N950 Postmenopausal bleeding: Secondary | ICD-10-CM | POA: Diagnosis present

## 2015-07-11 DIAGNOSIS — F419 Anxiety disorder, unspecified: Secondary | ICD-10-CM | POA: Insufficient documentation

## 2015-07-11 DIAGNOSIS — I129 Hypertensive chronic kidney disease with stage 1 through stage 4 chronic kidney disease, or unspecified chronic kidney disease: Secondary | ICD-10-CM | POA: Diagnosis not present

## 2015-07-11 DIAGNOSIS — N882 Stricture and stenosis of cervix uteri: Secondary | ICD-10-CM | POA: Diagnosis not present

## 2015-07-11 DIAGNOSIS — E119 Type 2 diabetes mellitus without complications: Secondary | ICD-10-CM | POA: Diagnosis not present

## 2015-07-11 DIAGNOSIS — Z87891 Personal history of nicotine dependence: Secondary | ICD-10-CM | POA: Diagnosis not present

## 2015-07-11 DIAGNOSIS — N189 Chronic kidney disease, unspecified: Secondary | ICD-10-CM | POA: Insufficient documentation

## 2015-07-11 HISTORY — PX: HYSTEROSCOPY WITH D & C: SHX1775

## 2015-07-11 LAB — GLUCOSE, CAPILLARY
GLUCOSE-CAPILLARY: 118 mg/dL — AB (ref 65–99)
GLUCOSE-CAPILLARY: 124 mg/dL — AB (ref 65–99)

## 2015-07-11 LAB — POCT PREGNANCY, URINE: PREG TEST UR: NEGATIVE

## 2015-07-11 SURGERY — DILATATION AND CURETTAGE /HYSTEROSCOPY
Anesthesia: General

## 2015-07-11 MED ORDER — KETOROLAC TROMETHAMINE 30 MG/ML IJ SOLN
INTRAMUSCULAR | Status: DC | PRN
  Administered 2015-07-11: 30 mg via INTRAVENOUS

## 2015-07-11 MED ORDER — HYDROCODONE-ACETAMINOPHEN 5-300 MG PO TABS: 1.0000 | ORAL_TABLET | Freq: Four times a day (QID) | ORAL | Status: DC | PRN

## 2015-07-11 MED ORDER — FENTANYL CITRATE (PF) 100 MCG/2ML IJ SOLN
INTRAMUSCULAR | Status: DC | PRN
  Administered 2015-07-11: 50 ug via INTRAVENOUS

## 2015-07-11 MED ORDER — PROPOFOL 10 MG/ML IV BOLUS
INTRAVENOUS | Status: DC | PRN
  Administered 2015-07-11: 50 mg via INTRAVENOUS
  Administered 2015-07-11: 150 mg via INTRAVENOUS

## 2015-07-11 MED ORDER — MIDAZOLAM HCL 2 MG/2ML IJ SOLN
INTRAMUSCULAR | Status: DC | PRN
  Administered 2015-07-11: 2 mg via INTRAVENOUS

## 2015-07-11 MED ORDER — SODIUM CHLORIDE 0.9 % IV SOLN
INTRAVENOUS | Status: DC
  Administered 2015-07-11 (×2): via INTRAVENOUS

## 2015-07-11 MED ORDER — LACTATED RINGERS IV SOLN: INTRAVENOUS | Status: DC

## 2015-07-11 MED ORDER — GLYCOPYRROLATE 0.2 MG/ML IJ SOLN
INTRAMUSCULAR | Status: DC | PRN
  Administered 2015-07-11: 0.2 mg via INTRAVENOUS

## 2015-07-11 MED ORDER — FENTANYL CITRATE (PF) 100 MCG/2ML IJ SOLN: 25.0000 ug | INTRAMUSCULAR | Status: DC | PRN

## 2015-07-11 MED ORDER — ONDANSETRON HCL 4 MG/2ML IJ SOLN: 4.0000 mg | Freq: Once | INTRAMUSCULAR | Status: DC | PRN

## 2015-07-11 MED ORDER — IBUPROFEN 200 MG PO TABS: 600.0000 mg | ORAL_TABLET | Freq: Four times a day (QID) | ORAL | Status: DC | PRN

## 2015-07-11 MED ORDER — ONDANSETRON HCL 4 MG/2ML IJ SOLN
INTRAMUSCULAR | Status: DC | PRN
  Administered 2015-07-11: 4 mg via INTRAVENOUS

## 2015-07-11 MED ORDER — LIDOCAINE HCL (CARDIAC) 20 MG/ML IV SOLN
INTRAVENOUS | Status: DC | PRN
  Administered 2015-07-11: 100 mg via INTRAVENOUS

## 2015-07-11 SURGICAL SUPPLY — 11 items
CATH ROBINSON RED A/P 16FR (CATHETERS) ×3 IMPLANT
GLOVE BIO SURGEON STRL SZ8 (GLOVE) ×3 IMPLANT
GOWN STRL REUS W/ TWL LRG LVL3 (GOWN DISPOSABLE) ×1 IMPLANT
GOWN STRL REUS W/ TWL XL LVL3 (GOWN DISPOSABLE) ×1 IMPLANT
GOWN STRL REUS W/TWL LRG LVL3 (GOWN DISPOSABLE) ×2
GOWN STRL REUS W/TWL XL LVL3 (GOWN DISPOSABLE) ×2
IV LACTATED RINGERS 1000ML (IV SOLUTION) ×3 IMPLANT
KIT RM TURNOVER CYSTO AR (KITS) ×3 IMPLANT
PACK DNC HYST (MISCELLANEOUS) ×3 IMPLANT
PAD OB MATERNITY 4.3X12.25 (PERSONAL CARE ITEMS) ×3 IMPLANT
PAD PREP 24X41 OB/GYN DISP (PERSONAL CARE ITEMS) ×3 IMPLANT

## 2015-07-11 NOTE — Anesthesia Procedure Notes (Signed)
Procedure Name: LMA Insertion Date/Time: 07/11/2015 8:01 AM Performed by: Doreen Salvage Pre-anesthesia Checklist: Patient identified, Patient being monitored, Timeout performed, Emergency Drugs available and Suction available Patient Re-evaluated:Patient Re-evaluated prior to inductionOxygen Delivery Method: Circle system utilized Preoxygenation: Pre-oxygenation with 100% oxygen Intubation Type: IV induction Ventilation: Mask ventilation without difficulty LMA: LMA inserted LMA Size: 3.5 Tube type: Oral Number of attempts: 1 Placement Confirmation: positive ETCO2 and breath sounds checked- equal and bilateral Tube secured with: Tape Dental Injury: Teeth and Oropharynx as per pre-operative assessment

## 2015-07-11 NOTE — Anesthesia Postprocedure Evaluation (Signed)
Anesthesia Post Note  Patient: Lindsey Caldwell  Procedure(s) Performed: Procedure(s) (LRB): DILATATION AND CURETTAGE /HYSTEROSCOPY (N/A)  Patient location during evaluation: PACU Anesthesia Type: General Level of consciousness: awake and alert Pain management: pain level controlled Vital Signs Assessment: post-procedure vital signs reviewed and stable Respiratory status: spontaneous breathing and respiratory function stable Cardiovascular status: stable Anesthetic complications: no    Last Vitals:  Filed Vitals:   07/11/15 0915 07/11/15 0935  BP:  107/76  Pulse: 88 82  Temp:    Resp: 16 9    Last Pain:  Filed Vitals:   07/11/15 0947  PainSc: 0-No pain                 KEPHART,WILLIAM K

## 2015-07-11 NOTE — Transfer of Care (Signed)
Immediate Anesthesia Transfer of Care Note  Patient: Lindsey Caldwell  Procedure(s) Performed: Procedure(s): DILATATION AND CURETTAGE /HYSTEROSCOPY (N/A)  Patient Location: PACU  Anesthesia Type:General  Level of Consciousness: sedated  Airway & Oxygen Therapy: Patient Spontanous Breathing and Patient connected to face mask oxygen  Post-op Assessment: Report given to RN and Post -op Vital signs reviewed and stable  Post vital signs: Reviewed and stable  Last Vitals:  Filed Vitals:   07/11/15 0559 07/11/15 0850  BP: 149/84 147/73  Pulse: 77 133  Temp: 35.7 C 36.2 C  Resp: 16 21    Complications: No apparent anesthesia complications

## 2015-07-11 NOTE — Anesthesia Preprocedure Evaluation (Signed)
Anesthesia Evaluation  Patient identified by MRN, date of birth, ID band Patient awake    Reviewed: Allergy & Precautions, NPO status , Patient's Chart, lab work & pertinent test results  History of Anesthesia Complications Negative for: history of anesthetic complications  Airway Mallampati: III       Dental   Pulmonary neg pulmonary ROS, sleep apnea and Continuous Positive Airway Pressure Ventilation , former smoker,           Cardiovascular hypertension, Pt. on medications      Neuro/Psych Anxiety    GI/Hepatic negative GI ROS, Neg liver ROS, GERD  Medicated and Controlled,  Endo/Other  diabetes, Oral Hypoglycemic Agents  Renal/GU Renal disease (issues with diuretic)     Musculoskeletal   Abdominal   Peds  Hematology negative hematology ROS (+)   Anesthesia Other Findings   Reproductive/Obstetrics                             Anesthesia Physical Anesthesia Plan  ASA: III  Anesthesia Plan: General   Post-op Pain Management:    Induction: Intravenous  Airway Management Planned: LMA  Additional Equipment:   Intra-op Plan:   Post-operative Plan:   Informed Consent: I have reviewed the patients History and Physical, chart, labs and discussed the procedure including the risks, benefits and alternatives for the proposed anesthesia with the patient or authorized representative who has indicated his/her understanding and acceptance.     Plan Discussed with:   Anesthesia Plan Comments:         Anesthesia Quick Evaluation

## 2015-07-11 NOTE — Op Note (Addendum)
OPERATIVE NOTE  Date of surgery: 07/11/2015  Preoperative diagnosis:  1. Postmenopausal bleeding 2. Postcoital bleeding 3. Multiple fibroids 4. Cervical stenosis  Postoperative diagnosis:  1. Postmenopausal bleeding 2. Postcoital bleeding 3. Multiple fibroids 4. Cervical stenosis 5. Uterine septum 6. Endometrial polyp  Operative procedure: 1. Hysteroscopy 2. Dilation and curettage of endometrium  Surgeon: Dr. Zipporah Plants Assistant: None Anesthesia: Gen.   Findings: Moderate cervical stenosis requiring lacrimal duct dilators; uterine septum; 7 mm endometrial polyp in left cornua region Pelvis: Gynecoid Uterus: sounded to 9 cm  Description of procedure:  Patient was brought to the operating room where she was placed in supine position. General anesthesia was induced without difficulty using the LMA technique. The patient was placed in dorsal lithotomy position using candy cane stirrups. A betadine perineal intravaginal prep and drape was performed in standard fashion. Weighted speculum was placed in the vagina. Single-tooth tenaculum was placed on the anterior cervix.  cervical stenosis was identified; lacrimal ducts look dilators were used to identify the endocervical and endometrial canal. Eventually the Uterus was sounded to 9 cm. Hanks dilators were used up to a #20 [place] caliber to dilate the endocervical canal. The [facility] hysteroscope using lactated Ringer's as irrigant was used for the hysteroscopy. The above-noted findings were photo documented. The hysteroscope removed. The smooth and serrated curettes were then used to curettage the endometrial cavity with production of minimal tissue. Stone polyp forceps were used to grab 1 endometrial polyp. Repeat hysteroscopy was performed and demonstrated excellent sampling. Procedure was then terminated with all instrumentation being removed from the vagina. The patient was then awakened mobilized and taken to the recovery room in  satisfactory condition.  IV fluids: see anesthesia record  Urine output:150 mL EBL: Less than 42mL.  Instruments, needles, and sponge counts were verified as correct.  Brayton Mars, MD 07/11/2015   Addendum: The patient is an LAVH candidate if hysterectomy is needed.

## 2015-07-11 NOTE — Discharge Instructions (Signed)

## 2015-07-11 NOTE — Interval H&P Note (Signed)
History and Physical Interval Note:  07/11/2015 7:47 AM  Lindsey Caldwell  has presented today for surgery, with the diagnosis of POST MENOPAUSAL BLEEDING,POSTCOITAL BLEEDING, UTERINE LEIOMYOMA, THICKENED ENDOMETRIUM, CERVICAL STENOSIS  The various methods of treatment have been discussed with the patient and family. After consideration of risks, benefits and other options for treatment, the patient has consented to  Procedure(s): DILATATION AND CURETTAGE /HYSTEROSCOPY (N/A) as a surgical intervention .  The patient's history has been reviewed, patient examined, no change in status, stable for surgery.  I have reviewed the patient's chart and labs.  Questions were answered to the patient's satisfaction.     Hassell Done A Gaelle Adriance

## 2015-07-11 NOTE — H&P (View-Only) (Signed)
Subjective:  PREOPERATIVE HISTORY AND PHYSICAL     Patient is a 60 y.o. [id number] scheduled for Hysteroscopy/D&C due to history of postmenopausal bleeding with inability to do endometrial sampling with in the office.  Ultrasound 02/03/2015 demonstrated fibroid uterus with at least 3 fibroids (largest measuring 3.4 cm); endometrium was indistinct but appeared to be slightly thickened; endometrial fluid collection was noted measuring 1.3 cm; possible endometrial polyp measuring 5 mm was noted along this fluid collection. No adnexal masses were seen.  06/21/2015 endometrial biopsy was attempted but discontinued due to cervical stenosis.   Menstrual History: OB History    Gravida Para Term Preterm AB TAB SAB Ectopic Multiple Living   4 1 1  2   2  2       Menarche age:NA  No LMP recorded. Patient is postmenopausal.    Past Medical History  Diagnosis Date  . Diabetes mellitus without complication ([place])   . Hypertension   . Anxiety   . High cholesterol   . Cholestanol storage disease   . GERD (gastroesophageal reflux disease)   . Sleep apnea   . Chronic kidney disease     Past Surgical History  Procedure Laterality Date  . Back surgery  07/12/10    Dr. Arnoldo Morale  . Knee surgery  08/01/12  . Tubal ligation      x2  . Colonoscopy with propofol N/A 04/13/2015    Procedure: COLONOSCOPY WITH PROPOFOL;  Surgeon: Manya Silvas, MD;  Location[facility] END[facility]COPY;  Service: Endoscopy;  Laterality: N/A;    OB History  Gravida Para Term Preterm AB SAB TAB Ectopic Multiple Living  4 1 1  2   2  2     # Outcome Date GA Lbr Len/2nd Weight Sex Delivery Anes PTL Lv  4 Gravida 1975    M Vag-Spont   Y  3 Term 1970    M Vag-Spont   Y  2 Ectopic           1 Ectopic               Social History   Social History  . Marital Status: Married    Spouse Name: N/A  . Number of Children: N/A  . Years of Education: N/A   Social History Main Topics  . Smoking status: Former Research scientist (life sciences)  . Smokeless  tobacco: Never Used     Comment: quit 16 years ago   . Alcohol Use: No  . Drug Use: No  . Sexual Activity: Yes    Birth Control/ Protection: Post-menopausal   Other Topics Concern  . None   Social History Narrative   Lives in [place] with husband and granddaughter. Has 2 sons. One lives in [place] and one in [place]. No pets.      Work - Management consultant as a guard, alternating days and night    Family History  Problem Relation Age of Onset  . Cancer Father     Hodgkins  . Diabetes Sister   . Cancer Sister     brain tumor  . Cancer Sister     breast  . Breast cancer Sister 77  . Colon cancer Neg Hx   . Ovarian cancer Neg Hx      (Not in a hospital admission)  Allergies  Allergen Reactions  . Gadolinium Derivatives Hives    Hives on chest/redness/itching after 19 cc multihance-benadryl was given per radiologist Hives on chest/redness/itching after 19 cc multihance-benadryl was given per radiologist  . Codeine Nausea  And Vomiting  . Percocet [Oxycodone-Acetaminophen] Nausea And Vomiting    Review of Systems Constitutional: No recent fever/chills/sweats Respiratory: No recent cough/bronchitis Cardiovascular: No chest pain Gastrointestinal: No recent nausea/vomiting/diarrhea Genitourinary: No UTI symptoms Hematologic/lymphatic:No history of coagulopathy or recent blood thinner use    Objective:    BP 138/75 mmHg  Pulse 88  Ht 5\' 4"  (1.626 m)  Wt 253 lb (114.76 kg)  BMI 43.41 kg/m2  General:   Normal  Skin:   normal  HEENT:  Normal  Neck:  Supple without Adenopathy or Thyromegaly  Lungs:   Heart:              Breasts:   Abdomen:  Pelvis:  M/S   Extremeties:  Neuro:    clear to auscultation bilaterally   Normal without murmur   Not Examined   soft, non-tender; bowel sounds normal; no masses,  no organomegaly   Exam deferred to OR  No CVAT  Warm/Dry   Normal       06/21/2015 PELVIC: External Genitalia:  Normal BUS: Normal Vagina: Normal Cervix: Stenotic Uterus: Normal size, midplane, shape, mobile Adnexa: Normal RV: Not examined. Bladder: Nontender MUSCULOSKELETAL: Normal range of motion. No tenderness. No clubbing, or edema.  Assessment:  1. Postmenopausal bleeding - [place] already done - multiple uterine fibroids, endometrial thickening, possible endometrial polyp - endometrial biopsy attempted but limited by cervical stenosis  2. Postcoital bleeding  3. Uterine leiomyoma, unspecified location  4. Thickened endometrium  5. Cervical stenosis (uterine cervix)  6. Morbid obesity  Plan:  Hysteroscopy/D&C  Preoperative counseling: The patient is to undergo hysteroscopy/D&C for postmenopausal bleeding and postcoital bleeding. She is understanding of the planned procedure and is aware of and is accepting of all surgical risks which include but are not limited to bleeding, infection, pelvic organ injury with need for repair, blood clot disorders, anesthesia risks, etc. All questions have been answered. Informed consent is given. Patient is ready and willing to proceed with surgery as scheduled.  Brayton Mars, MD  Note: This dictation was prepared with Dragon dictation along with smaller phrase technology. Any transcriptional errors that result from this process are unintentional.

## 2015-07-11 NOTE — OR Nursing (Signed)
Patient using spirometer tolerating well

## 2015-07-12 LAB — SURGICAL PATHOLOGY

## 2015-07-26 ENCOUNTER — Encounter: Payer: Managed Care, Other (non HMO) | Admitting: Obstetrics and Gynecology

## 2015-07-27 ENCOUNTER — Encounter: Payer: Self-pay | Admitting: Obstetrics and Gynecology

## 2015-07-27 ENCOUNTER — Ambulatory Visit (INDEPENDENT_AMBULATORY_CARE_PROVIDER_SITE_OTHER): Payer: Managed Care, Other (non HMO) | Admitting: Obstetrics and Gynecology

## 2015-07-27 VITALS — BP 124/74 | HR 84 | Ht 64.0 in | Wt 250.3 lb

## 2015-07-27 DIAGNOSIS — R938 Abnormal findings on diagnostic imaging of other specified body structures: Secondary | ICD-10-CM

## 2015-07-27 DIAGNOSIS — N840 Polyp of corpus uteri: Secondary | ICD-10-CM | POA: Insufficient documentation

## 2015-07-27 DIAGNOSIS — R9389 Abnormal findings on diagnostic imaging of other specified body structures: Secondary | ICD-10-CM

## 2015-07-27 DIAGNOSIS — D259 Leiomyoma of uterus, unspecified: Secondary | ICD-10-CM

## 2015-07-27 DIAGNOSIS — Z09 Encounter for follow-up examination after completed treatment for conditions other than malignant neoplasm: Secondary | ICD-10-CM

## 2015-07-27 DIAGNOSIS — N950 Postmenopausal bleeding: Secondary | ICD-10-CM

## 2015-07-27 DIAGNOSIS — N882 Stricture and stenosis of cervix uteri: Secondary | ICD-10-CM

## 2015-07-27 NOTE — Patient Instructions (Signed)
1. Pathology from [facility] was benign endometrial polyp 2. Resume all activities without restriction 3. Resume routine care with Dr. Ronette Deter 4. Return as needed for any gynecologic issues

## 2015-07-27 NOTE — Progress Notes (Signed)
Chief complaint: 1. One week postop check 2. Status post hysteroscopy/D&C  Patient has done well since surgery with no pelvic pain, abnormal uterine bleeding, or vaginal discharge.  Pathology from surgery demonstrated benign endometrial polyp and fragments of smooth muscle consistent with leiomyoma no hyperplasia or cancer was identified.  OBJECTIVE: BP 124/74 mmHg  Pulse 84  Ht 5\' 4"  (1.626 m)  Wt 250 lb 4.8 oz (113.535 kg)  BMI 42.94 kg/m2 Physical exam-deferred  ASSESSMENT: 1. Normal postop check 1 week status post hysteroscopy/D&C for evaluation of postmenopausal bleeding 2. Pathology demonstrated benign endometrial polyp, smooth muscle, no evidence of hyperplasia or carcinoma  PLAN: 1. Resume all activities without restriction 2. Return to Dr. Ronette Deter for routine healthcare 3. Return here as needed if gynecologic disease develop.  Brayton Mars, MD  Note: This dictation was prepared with Dragon dictation along with smaller phrase technology. Any transcriptional errors that result from this process are unintentional.

## 2015-08-22 ENCOUNTER — Ambulatory Visit (INDEPENDENT_AMBULATORY_CARE_PROVIDER_SITE_OTHER): Payer: Managed Care, Other (non HMO) | Admitting: Internal Medicine

## 2015-08-22 ENCOUNTER — Telehealth: Payer: Self-pay | Admitting: *Deleted

## 2015-08-22 ENCOUNTER — Encounter: Payer: Self-pay | Admitting: Internal Medicine

## 2015-08-22 VITALS — BP 120/70 | HR 96 | Ht 64.0 in | Wt 248.0 lb

## 2015-08-22 DIAGNOSIS — E1121 Type 2 diabetes mellitus with diabetic nephropathy: Secondary | ICD-10-CM | POA: Diagnosis not present

## 2015-08-22 DIAGNOSIS — R202 Paresthesia of skin: Secondary | ICD-10-CM | POA: Insufficient documentation

## 2015-08-22 DIAGNOSIS — R350 Frequency of micturition: Secondary | ICD-10-CM | POA: Insufficient documentation

## 2015-08-22 LAB — POCT URINALYSIS DIPSTICK
BILIRUBIN UA: NEGATIVE
Glucose, UA: NEGATIVE
KETONES UA: NEGATIVE
Nitrite, UA: NEGATIVE
PH UA: 5.5
Protein, UA: 30
RBC UA: NEGATIVE
Spec Grav, UA: 1.02
Urobilinogen, UA: 1

## 2015-08-22 NOTE — Progress Notes (Signed)
Subjective:    Patient ID: Lindsey Caldwell, female    DOB: November 14, 1955, 60 y.o.   MRN: [id number]  HPI  60YO female presents for acute visit.  Tingling in hands and feet - Tips of fingers and toes for about about 2 weeks. No changes in medications. Occurs throughout day and night. Not changed by position. Not taking anything for this.  DM - BG well controlled near 140 post prandial.  Frequent urination - Wakes several times per night to urinate. No dysuria. No hematuria. No fever, chills. No flank pain. She drinks water frequently during day.  Wt Readings from Last 3 Encounters:  08/22/15 248 lb (112.492 kg)  07/27/15 250 lb 4.8 oz (113.535 kg)  07/11/15 252 lb (114.306 kg)   BP Readings from Last 3 Encounters:  08/22/15 120/70  07/27/15 124/74  07/11/15 157/71    Past Medical History  Diagnosis Date  . Diabetes mellitus without complication ([place])   . Hypertension   . Anxiety   . High cholesterol   . Cholestanol storage disease   . GERD (gastroesophageal reflux disease)   . Chronic kidney disease   . Sleep apnea     C-PAP   Family History  Problem Relation Age of Onset  . Cancer Father     Hodgkins  . Diabetes Sister   . Cancer Sister     brain tumor  . Cancer Sister     breast  . Breast cancer Sister 50  . Colon cancer Neg Hx   . Ovarian cancer Neg Hx    Past Surgical History  Procedure Laterality Date  . Back surgery  07/12/10    Dr. Arnoldo Morale  . Knee surgery Right 08/01/12    Dr. Marry Guan ,[facility]  . Tubal ligation      x2  . Colonoscopy with propofol N/A 04/13/2015    Procedure: COLONOSCOPY WITH PROPOFOL;  Surgeon: Manya Silvas, MD;  Location: [facility] ENDOSCOPY;  Service: Endoscopy;  Laterality: N/A;  . Dilation and curettage of uterus    . Hysteroscopy w/d&c N/A 07/11/2015    Procedure: DILATATION AND CURETTAGE /HYSTEROSCOPY;  Surgeon: Brayton Mars, MD;  Location: [facility] ORS;  Service: Gynecology;  Laterality: N/A;   Social History   Social History    . Marital Status: Married    Spouse Name: N/A  . Number of Children: N/A  . Years of Education: N/A   Social History Main Topics  . Smoking status: Former Research scientist (life sciences)  . Smokeless tobacco: Never Used     Comment: quit 16 years ago   . Alcohol Use: No  . Drug Use: No  . Sexual Activity: Yes    Birth Control/ Protection: Post-menopausal   Other Topics Concern  . None   Social History Narrative   Lives in [place] with husband and granddaughter. Has 2 sons. One lives in [place] and one in [place]. No pets.      Work - Management consultant as a guard, alternating days and night    Review of Systems  Constitutional: Negative for fever, chills, appetite change, fatigue and unexpected weight change.  Eyes: Negative for visual disturbance.  Respiratory: Negative for cough and shortness of breath.   Cardiovascular: Negative for chest pain, palpitations and leg swelling.  Gastrointestinal: Negative for vomiting, abdominal pain, diarrhea and constipation.  Musculoskeletal: Negative for myalgias and arthralgias.  Skin: Negative for color change and rash.  Neurological: Positive for numbness. Negative for tremors, syncope, weakness and headaches.  Hematological: Negative for adenopathy. Does  not bruise/bleed easily.  Psychiatric/Behavioral: Negative for sleep disturbance and dysphoric mood. The patient is not nervous/anxious.        Objective:    BP 120/70 mmHg  Pulse 96  Ht 5\' 4"  (1.626 m)  Wt 248 lb (112.492 kg)  BMI 42.55 kg/m2  SpO2 95% Physical Exam  Constitutional: She is oriented to person, place, and time. She appears well-developed and well-nourished. No distress.  HENT:  Head: Normocephalic and atraumatic.  Right Ear: External ear normal.  Left Ear: External ear normal.  Nose: Nose normal.  Mouth/Throat: Oropharynx is clear and moist. No oropharyngeal exudate.  Eyes: Conjunctivae are normal. Pupils are equal, round, and reactive to light. Right eye exhibits no  discharge. Left eye exhibits no discharge. No scleral icterus.  Neck: Normal range of motion. Neck supple. No tracheal deviation present. No thyromegaly present.  Cardiovascular: Normal rate, regular rhythm, normal heart sounds and intact distal pulses.  Exam reveals no gallop and no friction rub.   No murmur heard. Pulmonary/Chest: Effort normal and breath sounds normal. No respiratory distress. She has no wheezes. She has no rales. She exhibits no tenderness.  Musculoskeletal: Normal range of motion. She exhibits no edema or tenderness.  Lymphadenopathy:    She has no cervical adenopathy.  Neurological: She is alert and oriented to person, place, and time. She displays no atrophy. No cranial nerve deficit or sensory deficit. She exhibits normal muscle tone. Coordination and gait normal.  No current numbness or tingling noted by pt on exam  Skin: Skin is warm and dry. No rash noted. She is not diaphoretic. No erythema. No pallor.  Psychiatric: She has a normal mood and affect. Her behavior is normal. Judgment and thought content normal.          Assessment & Plan:   Problem List Items Addressed This Visit      Unprioritized   Diabetes type 2, controlled ([place])    Will check A1c with labs. Previously diet controlled.      Relevant Orders   Hemoglobin A1c   Frequent urination    Frequent urination noted by pt. Will check UA and culture with labs.      Relevant Orders   POCT Urinalysis Dipstick   CULTURE, URINE COMPREHENSIVE   Paresthesia - Primary    Recent tingling in hands and feet. Discussed potential causes of this. Will check basic labs as noted. If normal, discussed referral to neurology for EMG testing. Follow up in 1 week.      Relevant Orders   Comprehensive metabolic panel   TSH   [id number]   VITAMIN D 25 Hydroxy (Vit-D Deficiency, Fractures)       Return in about 1 week (around 08/29/2015) for Recheck.  Ronette Deter, MD Internal Medicine [facility]

## 2015-08-22 NOTE — Telephone Encounter (Signed)
Spoke with patient and scheduled at 1pm on the 11th, thanks

## 2015-08-22 NOTE — Telephone Encounter (Signed)
Please advise a time, thanks

## 2015-08-22 NOTE — Addendum Note (Signed)
Addended by: Karlene Einstein D on: 08/22/2015 04:58 PM   Modules accepted: Orders

## 2015-08-22 NOTE — Progress Notes (Signed)
Pre visit review using our clinic review tool, if applicable. No additional management support is needed unless otherwise documented below in the visit note. 

## 2015-08-22 NOTE — Telephone Encounter (Signed)
Patient will need a 30 min follow up on Dr. Gilford Rile schedule next week on Monday,Tuesday or Wednesday. Pt contact [phone] Message can be left on voicemail

## 2015-08-22 NOTE — Patient Instructions (Signed)
Labs today        Follow up next week

## 2015-08-22 NOTE — Assessment & Plan Note (Signed)
Frequent urination noted by pt. Will check UA and culture with labs.

## 2015-08-22 NOTE — Telephone Encounter (Signed)
Tuesday at 1pm for 85min

## 2015-08-22 NOTE — Assessment & Plan Note (Signed)
Will check A1c with labs. Previously diet controlled.

## 2015-08-22 NOTE — Assessment & Plan Note (Signed)
Recent tingling in hands and feet. Discussed potential causes of this. Will check basic labs as noted. If normal, discussed referral to neurology for EMG testing. Follow up in 1 week.

## 2015-08-23 LAB — COMPREHENSIVE METABOLIC PANEL
ALBUMIN: 4 g/dL (ref 3.6–5.1)
ALT: 15 U/L (ref 6–29)
AST: 20 U/L (ref 10–35)
Alkaline Phosphatase: 84 U/L (ref 33–130)
BILIRUBIN TOTAL: 0.3 mg/dL (ref 0.2–1.2)
BUN: 18 mg/dL (ref 7–25)
CHLORIDE: 104 mmol/L (ref 98–110)
CO2: 22 mmol/L (ref 20–31)
CREATININE: 1.2 mg/dL — AB (ref 0.50–0.99)
Calcium: 9 mg/dL (ref 8.6–10.4)
Glucose, Bld: 114 mg/dL — ABNORMAL HIGH (ref 65–99)
Potassium: 3.8 mmol/L (ref 3.5–5.3)
SODIUM: 140 mmol/L (ref 135–146)
TOTAL PROTEIN: 6.7 g/dL (ref 6.1–8.1)

## 2015-08-23 LAB — VITAMIN D 25 HYDROXY (VIT D DEFICIENCY, FRACTURES): VIT D 25 HYDROXY: 29 ng/mL — AB (ref 30–100)

## 2015-08-23 LAB — HEMOGLOBIN A1C
Hgb A1c MFr Bld: 5.9 % — ABNORMAL HIGH (ref <5.7)
Mean Plasma Glucose: 123 mg/dL

## 2015-08-23 LAB — VITAMIN B12: VITAMIN B 12: 394 pg/mL (ref 200–1100)

## 2015-08-23 LAB — TSH: TSH: 1.67 mIU/L

## 2015-08-24 ENCOUNTER — Telehealth: Payer: Self-pay | Admitting: *Deleted

## 2015-08-24 NOTE — Telephone Encounter (Signed)
Patient has requested lab results Pt contact [phone]

## 2015-08-24 NOTE — Telephone Encounter (Signed)
Spoke with the patient, see result note for details. thanks

## 2015-08-25 ENCOUNTER — Ambulatory Visit: Payer: Self-pay | Admitting: Internal Medicine

## 2015-08-25 LAB — CULTURE, URINE COMPREHENSIVE: Colony Count: >=100000

## 2015-08-30 ENCOUNTER — Encounter: Payer: Self-pay | Admitting: Internal Medicine

## 2015-08-30 ENCOUNTER — Telehealth: Payer: Self-pay | Admitting: *Deleted

## 2015-08-30 ENCOUNTER — Ambulatory Visit (INDEPENDENT_AMBULATORY_CARE_PROVIDER_SITE_OTHER): Payer: Managed Care, Other (non HMO) | Admitting: Internal Medicine

## 2015-08-30 VITALS — BP 144/78 | HR 89 | Ht 64.0 in | Wt 249.0 lb

## 2015-08-30 DIAGNOSIS — Z1239 Encounter for other screening for malignant neoplasm of breast: Secondary | ICD-10-CM

## 2015-08-30 DIAGNOSIS — R202 Paresthesia of skin: Secondary | ICD-10-CM

## 2015-08-30 DIAGNOSIS — E538 Deficiency of other specified B group vitamins: Secondary | ICD-10-CM

## 2015-08-30 MED ORDER — CYANOCOBALAMIN 1000 MCG/ML IJ SOLN
1000.0000 ug | Freq: Once | INTRAMUSCULAR | Status: AC
  Administered 2015-08-30: 1000 ug via INTRAMUSCULAR

## 2015-08-30 MED ORDER — ALPRAZOLAM 0.5 MG PO TABS: 0.5000 mg | ORAL_TABLET | Freq: Every evening | ORAL | Status: DC | PRN

## 2015-08-30 MED ORDER — NYSTATIN-TRIAMCINOLONE 100000-0.1 UNIT/GM-% EX OINT: TOPICAL_OINTMENT | Freq: Two times a day (BID) | CUTANEOUS | Status: DC

## 2015-08-30 NOTE — Progress Notes (Signed)
Subjective:    Patient ID: Lindsey Caldwell, female    DOB: [birth year], 60 y.o.   MRN: [id number]  HPI  60YO female presents for follow up.  Recently seen for paresthesia in hands. Labs showed low B12 levels.  No new concerns today. No change in paresthesia.  Worried about her weight but has been unable to exercise. Considering spinal fusion surgery.  Wt Readings from Last 3 Encounters:  08/30/15 249 lb (112.946 kg)  08/22/15 248 lb (112.492 kg)  07/27/15 250 lb 4.8 oz (113.535 kg)   BP Readings from Last 3 Encounters:  08/30/15 144/78  08/22/15 120/70  07/27/15 124/74    Past Medical History  Diagnosis Date  . Diabetes mellitus without complication ([place])   . Hypertension   . Anxiety   . High cholesterol   . Cholestanol storage disease   . GERD (gastroesophageal reflux disease)   . Chronic kidney disease   . Sleep apnea     C-PAP   Family History  Problem Relation Age of Onset  . Cancer Father     Hodgkins  . Diabetes Sister   . Cancer Sister     brain tumor  . Cancer Sister     breast  . Breast cancer Sister 7  . Colon cancer Neg Hx   . Ovarian cancer Neg Hx    Past Surgical History  Procedure Laterality Date  . Back surgery  07/12/10    Dr. Arnoldo Morale  . Knee surgery Right 08/01/12    Dr. Marry Guan ,[facility]  . Tubal ligation      x2  . Colonoscopy with propofol N/A 04/13/2015    Procedure: COLONOSCOPY WITH PROPOFOL;  Surgeon: Manya Silvas, MD;  Location: [facility] ENDOSCOPY;  Service: Endoscopy;  Laterality: N/A;  . Dilation and curettage of uterus    . Hysteroscopy w/d&c N/A 07/11/2015    Procedure: DILATATION AND CURETTAGE /HYSTEROSCOPY;  Surgeon: Brayton Mars, MD;  Location: [facility] ORS;  Service: Gynecology;  Laterality: N/A;   Social History   Social History  . Marital Status: Married    Spouse Name: N/A  . Number of Children: N/A  . Years of Education: N/A   Social History Main Topics  . Smoking status: Former Research scientist (life sciences)  . Smokeless tobacco:  Never Used     Comment: quit 16 years ago   . Alcohol Use: No  . Drug Use: No  . Sexual Activity: Yes    Birth Control/ Protection: Post-menopausal   Other Topics Concern  . None   Social History Narrative   Lives in [place] with husband and granddaughter. Has 2 sons. One lives in [place] and one in [place]. No pets.      Work - Management consultant as a guard, alternating days and night    Review of Systems  Constitutional: Negative for fever, chills, appetite change, fatigue and unexpected weight change.  Eyes: Negative for visual disturbance.  Respiratory: Negative for cough and shortness of breath.   Cardiovascular: Negative for chest pain and leg swelling.  Gastrointestinal: Negative for nausea, vomiting, abdominal pain, diarrhea and constipation.  Musculoskeletal: Positive for myalgias, back pain and arthralgias.  Skin: Negative for color change and rash.  Neurological: Positive for numbness. Negative for weakness.  Hematological: Negative for adenopathy. Does not bruise/bleed easily.  Psychiatric/Behavioral: Negative for suicidal ideas, sleep disturbance and dysphoric mood. The patient is not nervous/anxious.        Objective:    BP 144/78 mmHg  Pulse 89  Ht 5'  4" (1.626 m)  Wt 249 lb (112.946 kg)  BMI 42.72 kg/m2  SpO2 94% Physical Exam  Constitutional: She is oriented to person, place, and time. She appears well-developed and well-nourished. No distress.  HENT:  Head: Normocephalic and atraumatic.  Right Ear: External ear normal.  Left Ear: External ear normal.  Nose: Nose normal.  Mouth/Throat: Oropharynx is clear and moist. No oropharyngeal exudate.  Eyes: Conjunctivae are normal. Pupils are equal, round, and reactive to light. Right eye exhibits no discharge. Left eye exhibits no discharge. No scleral icterus.  Neck: Normal range of motion. Neck supple. No tracheal deviation present. No thyromegaly present.  Cardiovascular: Normal rate, regular rhythm,  normal heart sounds and intact distal pulses.  Exam reveals no gallop and no friction rub.   No murmur heard. Pulmonary/Chest: Effort normal and breath sounds normal. No respiratory distress. She has no wheezes. She has no rales. She exhibits no tenderness.  Musculoskeletal: Normal range of motion. She exhibits no edema or tenderness.  Lymphadenopathy:    She has no cervical adenopathy.  Neurological: She is alert and oriented to person, place, and time. No cranial nerve deficit. She exhibits normal muscle tone. Coordination normal.  Skin: Skin is warm and dry. No rash noted. She is not diaphoretic. No erythema. No pallor.  Psychiatric: She has a normal mood and affect. Her behavior is normal. Judgment and thought content normal.          Assessment & Plan:   Problem List Items Addressed This Visit      Unprioritized   Paresthesia - Primary    No change in paresthesia. Labs remarkable for low B12. We discussed that this may be contributing to paresthesia, along with nerve compression from spinal issues. Will start B12 supplementation with weekly injections x 3 then monthly injections.       Other Visit Diagnoses    Screening for breast cancer        Relevant Orders    MM Digital Screening    B12 deficiency        Relevant Medications    cyanocobalamin ((VITAMIN B-12)) injection 1,000 mcg (Completed) (Start on 08/30/2015  1:45 PM)        Return in about 4 weeks (around 09/27/2015) for New Patient.  Ronette Deter, MD Internal Medicine [facility]

## 2015-08-30 NOTE — Telephone Encounter (Signed)
Pt requested to know the price to the vitamin B-12 vaccine.

## 2015-08-30 NOTE — Assessment & Plan Note (Signed)
No change in paresthesia. Labs remarkable for low B12. We discussed that this may be contributing to paresthesia, along with nerve compression from spinal issues. Will start B12 supplementation with weekly injections x 3 then monthly injections.

## 2015-08-30 NOTE — Progress Notes (Signed)
Pre visit review using our clinic review tool, if applicable. No additional management support is needed unless otherwise documented below in the visit note. 

## 2015-08-30 NOTE — Patient Instructions (Signed)
Start weekly B12 shots for 3 weeks, then monthly B12 injections.  Follow up in 4 weeks.

## 2015-08-30 NOTE — Telephone Encounter (Signed)
I am trying to find out the answer to this, I have two calls sent out waiting for responses, thanks

## 2015-08-31 NOTE — Telephone Encounter (Signed)
B12 is $10.00 and the admin of the B12 is $60.00; this is what gets billed to insurance. Insurance will not allow this much... So there will be an adjustment w/o . It does depend on the insurance what is allowed.  thanks

## 2015-09-05 ENCOUNTER — Telehealth: Payer: Self-pay | Admitting: *Deleted

## 2015-09-05 NOTE — Telephone Encounter (Signed)
Left patient a detailed message to have the provider send over the form to have it completed by Dr. Gilford Rile, thanks

## 2015-09-05 NOTE — Telephone Encounter (Signed)
Please advise, ?I am assuming this is regarding spinal surgery.

## 2015-09-05 NOTE — Telephone Encounter (Signed)
They usually send [place] a form to fill out. I am happy to fill it out. It will need to be within 30days of procedure.

## 2015-09-05 NOTE — Telephone Encounter (Signed)
Pt is scheduling for upcoming back surgery, she will need a statement from Dr. Gilford Rile that she's in good health to have the surgery, pt stated that she spoke with Dr. Gilford Rile about this surgery. Pt contact  [phone], a message can be left on voicemail

## 2015-09-07 ENCOUNTER — Ambulatory Visit (INDEPENDENT_AMBULATORY_CARE_PROVIDER_SITE_OTHER): Payer: Managed Care, Other (non HMO)

## 2015-09-07 DIAGNOSIS — E538 Deficiency of other specified B group vitamins: Secondary | ICD-10-CM

## 2015-09-07 MED ORDER — CYANOCOBALAMIN 1000 MCG/ML IJ SOLN
1000.0000 ug | Freq: Once | INTRAMUSCULAR | Status: AC
  Administered 2015-09-07: 1000 ug via INTRAMUSCULAR

## 2015-09-07 NOTE — Progress Notes (Signed)
Patient presented for second weekly B12 injection, per Dr. Thomes Dinning order.  Administered R deltoid.  Tolerated well.  Appointment scheduled for next week.

## 2015-09-13 ENCOUNTER — Ambulatory Visit (INDEPENDENT_AMBULATORY_CARE_PROVIDER_SITE_OTHER): Payer: Managed Care, Other (non HMO) | Admitting: Surgical

## 2015-09-13 DIAGNOSIS — E538 Deficiency of other specified B group vitamins: Secondary | ICD-10-CM | POA: Diagnosis not present

## 2015-09-13 MED ORDER — CYANOCOBALAMIN 1000 MCG/ML IJ SOLN
1000.0000 ug | Freq: Once | INTRAMUSCULAR | Status: AC
  Administered 2015-09-13: 1000 ug via INTRAMUSCULAR

## 2015-09-13 NOTE — Progress Notes (Signed)
Patient came in today for B 12 injection. Given left deltoid. Patient tolerated well.

## 2015-09-21 ENCOUNTER — Encounter: Payer: Self-pay | Admitting: Internal Medicine

## 2015-09-21 ENCOUNTER — Ambulatory Visit (INDEPENDENT_AMBULATORY_CARE_PROVIDER_SITE_OTHER): Payer: Managed Care, Other (non HMO) | Admitting: Internal Medicine

## 2015-09-21 VITALS — BP 130/70 | HR 78 | Ht 64.0 in | Wt 251.0 lb

## 2015-09-21 DIAGNOSIS — N183 Chronic kidney disease, stage 3 unspecified: Secondary | ICD-10-CM

## 2015-09-21 DIAGNOSIS — E538 Deficiency of other specified B group vitamins: Secondary | ICD-10-CM

## 2015-09-21 DIAGNOSIS — R202 Paresthesia of skin: Secondary | ICD-10-CM | POA: Diagnosis not present

## 2015-09-21 DIAGNOSIS — M5442 Lumbago with sciatica, left side: Secondary | ICD-10-CM

## 2015-09-21 MED ORDER — CYANOCOBALAMIN 1000 MCG/ML IJ SOLN
1000.0000 ug | Freq: Once | INTRAMUSCULAR | Status: AC
  Administered 2015-09-21: 1000 ug via INTRAMUSCULAR

## 2015-09-21 NOTE — Assessment & Plan Note (Signed)
Reviewed recent renal function with pt. Renal function relatively stable over last 12 months. Will continue to monitor. Continue Irbesartan.

## 2015-09-21 NOTE — Assessment & Plan Note (Addendum)
Persistent lower back pain, now with some sciatica. Scheduled to have lumbar spinal fusion at [facility] next month. Will follow up after surgery.

## 2015-09-21 NOTE — Progress Notes (Signed)
Subjective:    Patient ID: Lindsey Caldwell, female    DOB: [birth year], 60 y.o.   MRN: [id number]  HPI  60YO female presents for follow up.  Last seen in 08/2015 for paresthesia. Noted to have low B12. Started on B12 injections. No change noted in intermittent numbness in hands with B12 injection.  Scheduled to have lumbar spinal fusion next month at [place] about upcoming surgery, however pain in lower back and left leg has become unbearable. Limiting her ability to perform her job and complete ADLs at home. Taking Flexeril on occasional with minimal improvement. Pain described as a burning sensation that is constant.  Lab Results  Component Value Date   HGBA1C 5.9 (H) 08/22/2015     Wt Readings from Last 3 Encounters:  09/21/15 251 lb (113.9 kg)  08/30/15 249 lb (112.9 kg)  08/22/15 248 lb (112.5 kg)   BP Readings from Last 3 Encounters:  09/21/15 130/70  08/30/15 (!) 144/78  08/22/15 120/70    Past Medical History:  Diagnosis Date  . Anxiety   . Cholestanol storage disease   . Chronic kidney disease   . Diabetes mellitus without complication ([place])   . GERD (gastroesophageal reflux disease)   . High cholesterol   . Hypertension   . Sleep apnea    C-PAP   Family History  Problem Relation Age of Onset  . Cancer Father     Hodgkins  . Diabetes Sister   . Cancer Sister     brain tumor  . Cancer Sister     breast  . Breast cancer Sister 22  . Colon cancer Neg Hx   . Ovarian cancer Neg Hx    Past Surgical History:  Procedure Laterality Date  . BACK SURGERY  07/12/10   Dr. Arnoldo Morale  . COLONOSCOPY WITH PROPOFOL N/A 04/13/2015   Procedure: COLONOSCOPY WITH PROPOFOL;  Surgeon: Manya Silvas, MD;  Location[facility] END[facility]COPY;  Service: Endoscopy;  Laterality: N/A;  . DILATION AND CURETTAGE OF UTERUS    . HYSTEROSCOPY W/D&C N/A 07/11/2015   Procedure: DILATATION AND CURETTAGE /HYSTEROSCOPY;  Surgeon: Brayton Mars, MD;  Location: [facility] ORS;   Service: Gynecology;  Laterality: N/A;  . KNEE SURGERY Right 08/01/12   Dr. Marry Guan ,[facility]  . TUBAL LIGATION     x2   Social History   Social History  . Marital status: Married    Spouse name: N/A  . Number of children: N/A  . Years of education: N/A   Social History Main Topics  . Smoking status: Former Research scientist (life sciences)  . Smokeless tobacco: Never Used     Comment: quit 16 years ago   . Alcohol use No  . Drug use: No  . Sexual activity: Yes    Birth control/ protection: Post-menopausal   Other Topics Concern  . None   Social History Narrative   Lives in [place] with husband and granddaughter. Has 2 sons. One lives in [place] and one in [place]. No pets.      Work - Management consultant as a guard, alternating days and night    Review of Systems  Constitutional: Negative for appetite change, chills, fatigue, fever and unexpected weight change.  Eyes: Negative for visual disturbance.  Respiratory: Negative for cough, chest tightness and shortness of breath.   Cardiovascular: Negative for chest pain, palpitations and leg swelling.  Gastrointestinal: Negative for abdominal pain.  Musculoskeletal: Positive for arthralgias, back pain and myalgias.  Skin: Negative for color change and rash.  Neurological: Positive for numbness. Negative for weakness.  Hematological: Negative for adenopathy. Does not bruise/bleed easily.  Psychiatric/Behavioral: Negative for dysphoric mood. The patient is not nervous/anxious.        Objective:    BP 130/70 (BP Location: Left Arm, Patient Position: Sitting, Cuff Size: Large)   Pulse 78   Ht 5\' 4"  (1.626 m)   Wt 251 lb (113.9 kg)   SpO2 96%   BMI 43.08 kg/m  Physical Exam  Constitutional: She is oriented to person, place, and time. She appears well-developed and well-nourished. No distress.  HENT:  Head: Normocephalic and atraumatic.  Right Ear: External ear normal.  Left Ear: External ear normal.  Nose: Nose normal.  Mouth/Throat:  Oropharynx is clear and moist. No oropharyngeal exudate.  Eyes: Conjunctivae are normal. Pupils are equal, round, and reactive to light. Right eye exhibits no discharge. Left eye exhibits no discharge. No scleral icterus.  Neck: Normal range of motion. Neck supple. No tracheal deviation present. No thyromegaly present.  Cardiovascular: Normal rate, regular rhythm, normal heart sounds and intact distal pulses.  Exam reveals no gallop and no friction rub.   No murmur heard. Pulmonary/Chest: Effort normal and breath sounds normal. No respiratory distress. She has no wheezes. She has no rales. She exhibits no tenderness.  Musculoskeletal: Normal range of motion. She exhibits no edema or tenderness.       Lumbar back: She exhibits pain. She exhibits normal range of motion.  Lymphadenopathy:    She has no cervical adenopathy.  Neurological: She is alert and oriented to person, place, and time. No cranial nerve deficit. She exhibits normal muscle tone. Coordination normal.  Skin: Skin is warm and dry. No rash noted. She is not diaphoretic. No erythema. No pallor.  Psychiatric: She has a normal mood and affect. Her behavior is normal. Judgment and thought content normal.          Assessment & Plan:   Problem List Items Addressed This Visit      Unprioritized   Chronic kidney disease (CKD), stage III (moderate)    Reviewed recent renal function with pt. Renal function relatively stable over last 12 months. Will continue to monitor. Continue Irbesartan.      Left-sided low back pain with sciatica    Persistent lower back pain, now with some sciatica. Scheduled to have lumbar spinal fusion at [facility] next month. Will follow up after surgery.      Paresthesia - Primary    No change in paresthesia with B12 supplementation. After upcoming surgery, if paresthesias persist, would consider evaluation for carpal tunnel.       Other Visit Diagnoses    Low vitamin B12 level       Relevant  Medications   cyanocobalamin ((VITAMIN B-12)) injection 1,000 mcg (Completed)       Return in about 3 months (around 12/22/2015) for New Patient.  Ronette Deter, MD Internal [facility]

## 2015-09-21 NOTE — Assessment & Plan Note (Signed)
No change in paresthesia with B12 supplementation. After upcoming surgery, if paresthesias persist, would consider evaluation for carpal tunnel.

## 2015-09-21 NOTE — Patient Instructions (Signed)
Follow up after surgery

## 2015-09-21 NOTE — Progress Notes (Signed)
Pre visit review using our clinic review tool, if applicable. No additional management support is needed unless otherwise documented below in the visit note. 

## 2015-09-30 ENCOUNTER — Other Ambulatory Visit: Payer: Self-pay | Admitting: Internal Medicine

## 2015-09-30 ENCOUNTER — Ambulatory Visit
Admission: RE | Admit: 2015-09-30 | Discharge: 2015-09-30 | Disposition: A | Discharge status: Home / Self Care | Payer: Managed Care, Other (non HMO) | Source: Ambulatory Visit | Attending: Internal Medicine | Admitting: Internal Medicine

## 2015-09-30 DIAGNOSIS — R928 Other abnormal and inconclusive findings on diagnostic imaging of breast: Secondary | ICD-10-CM | POA: Insufficient documentation

## 2015-09-30 DIAGNOSIS — Z1231 Encounter for screening mammogram for malignant neoplasm of breast: Secondary | ICD-10-CM | POA: Diagnosis present

## 2015-09-30 DIAGNOSIS — Z1239 Encounter for other screening for malignant neoplasm of breast: Secondary | ICD-10-CM

## 2015-10-11 ENCOUNTER — Telehealth: Payer: Self-pay | Admitting: Internal Medicine

## 2015-10-11 ENCOUNTER — Other Ambulatory Visit: Payer: Self-pay | Admitting: Internal Medicine

## 2015-10-11 MED ORDER — DEXLANSOPRAZOLE 60 MG PO CPDR: DELAYED_RELEASE_CAPSULE | ORAL | 5 refills | Status: DC

## 2015-10-11 MED ORDER — AMLODIPINE BESYLATE 5 MG PO TABS: ORAL_TABLET | ORAL | 0 refills | Status: DC

## 2015-10-11 NOTE — Telephone Encounter (Signed)
Pt called about needing a refill for DEXILANT 60 MG capsule and amLODipine (NORVASC) 5 MG tablet.   Pharmacy is [facility], [place]  Call pt @ [phone]. Thank you!

## 2015-10-11 NOTE — Telephone Encounter (Signed)
Medication has been refilled.

## 2015-10-11 NOTE — Telephone Encounter (Signed)
Paper work has been faxed

## 2015-10-11 NOTE — Telephone Encounter (Signed)
Pt called about needing her labs and last three office visits fax to [facility] [phone] Dr Napoleon Form in [facility] back Psychologist, sport and exercise.   Call pt @ [phone]. Thank you!

## 2015-10-20 ENCOUNTER — Encounter: Payer: Self-pay | Admitting: Family

## 2015-10-20 ENCOUNTER — Ambulatory Visit (INDEPENDENT_AMBULATORY_CARE_PROVIDER_SITE_OTHER): Payer: Managed Care, Other (non HMO) | Admitting: Family

## 2015-10-20 VITALS — BP 132/72 | HR 81 | Temp 98.4°F | Wt 251.8 lb

## 2015-10-20 DIAGNOSIS — R350 Frequency of micturition: Secondary | ICD-10-CM

## 2015-10-20 DIAGNOSIS — L298 Other pruritus: Secondary | ICD-10-CM

## 2015-10-20 DIAGNOSIS — N898 Other specified noninflammatory disorders of vagina: Secondary | ICD-10-CM

## 2015-10-20 DIAGNOSIS — N390 Urinary tract infection, site not specified: Secondary | ICD-10-CM | POA: Diagnosis not present

## 2015-10-20 LAB — POCT URINALYSIS DIP (MANUAL ENTRY)
BILIRUBIN UA: NEGATIVE
GLUCOSE UA: NEGATIVE
Ketones, POC UA: NEGATIVE
NITRITE UA: NEGATIVE
PH UA: 5.5
Protein Ur, POC: 30 — AB
RBC UA: NEGATIVE
Spec Grav, UA: 1.02
UROBILINOGEN UA: 2

## 2015-10-20 MED ORDER — CEPHALEXIN 500 MG PO CAPS: 500.0000 mg | ORAL_CAPSULE | Freq: Two times a day (BID) | ORAL | 0 refills | Status: AC

## 2015-10-20 MED ORDER — FLUCONAZOLE 150 MG PO TABS: 150.0000 mg | ORAL_TABLET | Freq: Once | ORAL | 1 refills | Status: AC

## 2015-10-20 NOTE — Progress Notes (Signed)
Subjective:    Patient ID: Lindsey Caldwell, female    DOB: [birth year], 60 y.o.   MRN: [id number]  CC: Lindsey Caldwell is a 60 y.o. female who presents today for an acute visit.    HPI: Patient here for chief complaint of urinary frequency which started yesterday. No dysuria. No fever, chills. No concern for STDs. No recurrent UTIs.  Patient also complains that for past 6 days, she has intense vaginal itching on 'the outside'. No changes in vaginal in discharge. Nystatin cream is not helping itching, burns when puts on.    Works in a prison.       HISTORY:  Past Medical History:  Diagnosis Date  . Anxiety   . Cholestanol storage disease   . Chronic kidney disease   . Diabetes mellitus without complication ([place])   . GERD (gastroesophageal reflux disease)   . High cholesterol   . Hypertension   . Sleep apnea    C-PAP   Past Surgical History:  Procedure Laterality Date  . BACK SURGERY  07/12/10   Dr. Arnoldo Morale  . COLONOSCOPY WITH PROPOFOL N/A 04/13/2015   Procedure: COLONOSCOPY WITH PROPOFOL;  Surgeon: Manya Silvas, MD;  Location: [facility] ENDOSCOPY;  Service: Endoscopy;  Laterality: N/A;  . DILATION AND CURETTAGE OF UTERUS    . HYSTEROSCOPY W/D&C N/A 07/11/2015   Procedure: DILATATION AND CURETTAGE /HYSTEROSCOPY;  Surgeon: Brayton Mars, MD;  Location: [facility] ORS;  Service: Gynecology;  Laterality: N/A;  . KNEE SURGERY Right 08/01/12   Dr. Marry Guan ,[facility]  . TUBAL LIGATION     x2   Family History  Problem Relation Age of Onset  . Cancer Father     Hodgkins  . Diabetes Sister   . Cancer Sister     brain tumor  . Cancer Sister     breast  . Breast cancer Sister 77  . Colon cancer Neg Hx   . Ovarian cancer Neg Hx     Allergies: Gadolinium derivatives; Codeine; Iodine; and Percocet [oxycodone-acetaminophen] Current Outpatient Prescriptions on File Prior to Visit  Medication Sig Dispense Refill  . ALPRAZolam (XANAX) 0.5 MG tablet Take 1 tablet (0.5 mg total) by mouth  at bedtime as needed for anxiety. 90 tablet 1  . amLODipine (NORVASC) 5 MG tablet TAKE ONE (1) TABLET BY MOUTH EVERY DAY 90 tablet 0  . cyclobenzaprine (FLEXERIL) 10 MG tablet Take 10 mg by mouth 3 (three) times daily as needed for muscle spasms.    Marland Kitchen dexlansoprazole (DEXILANT) 60 MG capsule TAKE ONE (1) CAPSULE EACH DAY 30 capsule 5  . irbesartan (AVAPRO) 150 MG tablet TAKE ONE (1) TABLET BY MOUTH EVERY DAY 30 tablet 11  . lovastatin (MEVACOR) 20 MG tablet TAKE ONE TABLET DAILY AT BEDTIME 90 tablet 2  . montelukast (SINGULAIR) 10 MG tablet TAKE ONE TABLET DAILY AT BEDTIME (Patient taking differently: TAKE ONE TABLET DAILY) 90 tablet 2  . nystatin-triamcinolone ointment (MYCOLOG) Apply topically 2 (two) times daily. 60 g 3   No current facility-administered medications on file prior to visit.     Social History  Substance Use Topics  . Smoking status: Former Research scientist (life sciences)  . Smokeless tobacco: Never Used     Comment: quit 16 years ago   . Alcohol use No    Review of Systems  Constitutional: Negative for chills and fever.  Respiratory: Negative for cough.   Cardiovascular: Negative for chest pain and palpitations.  Gastrointestinal: Negative for nausea and vomiting.  Genitourinary: Positive for dysuria and  frequency. Negative for difficulty urinating, flank pain, vaginal bleeding, vaginal discharge and vaginal pain.      Objective:    BP 132/72   Pulse 81   Temp 98.4 F (36.9 C) (Oral)   Wt 251 lb 12.8 oz (114.2 kg)   SpO2 96%   BMI 43.22 kg/m    Physical Exam  Constitutional: She appears well-developed and well-nourished.  Cardiovascular: Normal rate, regular rhythm, normal heart sounds and normal pulses.   Pulmonary/Chest: Effort normal and breath sounds normal. She has no wheezes. She has no rhonchi. She has no rales.  Abdominal: There is no CVA tenderness.  Genitourinary: There is no rash, tenderness, lesion or injury on the right labia. There is no rash, tenderness, lesion  or injury on the left labia.  Genitourinary Comments: Diffuse labial erythema with excoriation. No discharge.  Neurological: She is alert.  Skin: Skin is warm and dry.  Psychiatric: She has a normal mood and affect. Her speech is normal and behavior is normal. Thought content normal.  Vitals reviewed.      Assessment & Plan:   1. Frequent urination/ UTI  Urinalysis is positive for trace leukocytes and protein (which is been persistent). Suspect proteinuria is related to type 2 diabetes. No glucose or ketones. Patient jointly decided to go ahead and start antibiotic for urinary symptoms. Pending urine culture.  - POCT urinalysis dipstick - cephALEXin (KEFLEX) 500 MG capsule; Take 1 capsule (500 mg total) by mouth 2 (two) times daily.  Dispense: 14 capsule; Refill: 0 - Urine Culture   3. Vaginal itching Inspection of external vagina and patient symptoms consistent with vulvovaginal candidiasis. Empirically treating with antifungal.  - fluconazole (DIFLUCAN) 150 MG tablet; Take 1 tablet (150 mg total) by mouth once. Take one tablet PO once. If continue to have symptoms, may take one tablet PO 3 days later.  Dispense: 2 tablet; Refill: 1    I am having Ms. Seaman start on fluconazole and cephALEXin. I am also having her maintain her cyclobenzaprine, irbesartan, montelukast, lovastatin, nystatin-triamcinolone ointment, ALPRAZolam, amLODipine, and dexlansoprazole.   Meds ordered this encounter  Medications  . fluconazole (DIFLUCAN) 150 MG tablet    Sig: Take 1 tablet (150 mg total) by mouth once. Take one tablet PO once. If continue to have symptoms, may take one tablet PO 3 days later.    Dispense:  2 tablet    Refill:  1    Order Specific Question:   Supervising Provider    Answer:   TULLO, TERESA L [[id number]]  . cephALEXin (KEFLEX) 500 MG capsule    Sig: Take 1 capsule (500 mg total) by mouth 2 (two) times daily.    Dispense:  14 capsule    Refill:  0    Order Specific Question:    Supervising Provider    Answer:   Crecencio Mc [[id number]]    Return precautions given.   Risks, benefits, and alternatives of the medications and treatment plan prescribed today were discussed, and patient expressed understanding.   Education regarding symptom management and diagnosis given to patient on AVS.  Continue to follow with Rica Mast, MD for routine health maintenance.   Sheenah Berkheimer and I agreed with plan.   Mable Paris, FNP

## 2015-10-20 NOTE — Progress Notes (Signed)
Pre visit review using our clinic review tool, if applicable. No additional management support is needed unless otherwise documented below in the visit note. 

## 2015-10-20 NOTE — Patient Instructions (Addendum)
Pleasure meeting you today.   Hold  Mevacor when take Diflucan. There is an interaction.   Drink plenty of water and take antibiotic as prescribed.   We are pending the urine culture to know the organism causing the infection and our office will call you with results. If the particular organism requires a different antibiotic than the on prescribed, we will place an order for a new prescription at that time.   If you symptoms worsen or you have new symptoms, please contact our office, or return to clinic for re evaluation.  Urinary Tract Infection Urinary tract infections (UTIs) can develop anywhere along your urinary tract. Your urinary tract is your body's drainage system for removing wastes and extra water. Your urinary tract includes two kidneys, two ureters, a bladder, and a urethra. Your kidneys are a pair of bean-shaped organs. Each kidney is about the size of your fist. They are located below your ribs, one on each side of your spine. CAUSES Infections are caused by microbes, which are microscopic organisms, including fungi, viruses, and bacteria. These organisms are so small that they can only be seen through a microscope. Bacteria are the microbes that most commonly cause UTIs. SYMPTOMS  Symptoms of UTIs may vary by age and gender of the patient and by the location of the infection. Symptoms in young women typically include a frequent and intense urge to urinate and a painful, burning feeling in the bladder or urethra during urination. Older women and men are more likely to be tired, shaky, and weak and have muscle aches and abdominal pain. A fever may mean the infection is in your kidneys. Other symptoms of a kidney infection include pain in your back or sides below the ribs, nausea, and vomiting. DIAGNOSIS To diagnose a UTI, your caregiver will ask you about your symptoms. Your caregiver will also ask you to provide a urine sample. The urine sample will be tested for bacteria and white  blood cells. White blood cells are made by your body to help fight infection. TREATMENT  Typically, UTIs can be treated with medication. Because most UTIs are caused by a bacterial infection, they usually can be treated with the use of antibiotics. The choice of antibiotic and length of treatment depend on your symptoms and the type of bacteria causing your infection. HOME CARE INSTRUCTIONS  If you were prescribed antibiotics, take them exactly as your caregiver instructs you. Finish the medication even if you feel better after you have only taken some of the medication.  Drink enough water and fluids to keep your urine clear or pale yellow.  Avoid caffeine, tea, and carbonated beverages. They tend to irritate your bladder.  Empty your bladder often. Avoid holding urine for long periods of time.  Empty your bladder before and after sexual intercourse.  After a bowel movement, women should cleanse from front to back. Use each tissue only once. SEEK MEDICAL CARE IF:   You have back pain.  You develop a fever.  Your symptoms do not begin to resolve within 3 days. SEEK IMMEDIATE MEDICAL CARE IF:   You have severe back pain or lower abdominal pain.  You develop chills.  You have nausea or vomiting.  You have continued burning or discomfort with urination. MAKE SURE YOU:   Understand these instructions.  Will watch your condition.  Will get help right away if you are not doing well or get worse.   This information is not intended to replace advice given to you  by your health care provider. Make sure you discuss any questions you have with your health care provider.   Document Released: 11/15/2004 Document Revised: 10/27/2014 Document Reviewed: 03/16/2011 Elsevier Interactive Patient Education [employer].

## 2015-10-21 LAB — URINE CULTURE

## 2015-10-25 ENCOUNTER — Ambulatory Visit (INDEPENDENT_AMBULATORY_CARE_PROVIDER_SITE_OTHER): Payer: Managed Care, Other (non HMO)

## 2015-10-25 DIAGNOSIS — E538 Deficiency of other specified B group vitamins: Secondary | ICD-10-CM | POA: Diagnosis not present

## 2015-10-25 DIAGNOSIS — E8809 Other disorders of plasma-protein metabolism, not elsewhere classified: Secondary | ICD-10-CM

## 2015-10-25 DIAGNOSIS — E889 Metabolic disorder, unspecified: Secondary | ICD-10-CM

## 2015-10-25 DIAGNOSIS — E7139 Other disorders of fatty-acid metabolism: Secondary | ICD-10-CM

## 2015-10-25 MED ORDER — CYANOCOBALAMIN 1000 MCG/ML IJ SOLN
1000.0000 ug | Freq: Once | INTRAMUSCULAR | Status: AC
  Administered 2015-10-25: 1000 ug via INTRAMUSCULAR

## 2015-10-25 NOTE — Progress Notes (Signed)
Pt was in today receiving a B12 injection in the left deltoid. Patient tolerated well.

## 2015-11-08 ENCOUNTER — Encounter: Payer: Self-pay | Admitting: Physician Assistant

## 2015-11-08 ENCOUNTER — Ambulatory Visit: Payer: Self-pay | Admitting: Physician Assistant

## 2015-11-08 VITALS — BP 139/78 | HR 86 | Temp 98.4°F

## 2015-11-08 DIAGNOSIS — M1000 Idiopathic gout, unspecified site: Secondary | ICD-10-CM

## 2015-11-08 MED ORDER — ALLOPURINOL 300 MG PO TABS: 300.0000 mg | ORAL_TABLET | Freq: Every day | ORAL | 6 refills | Status: AC

## 2015-11-08 MED ORDER — PREDNISONE 10 MG PO TABS
30.0000 mg | ORAL_TABLET | Freq: Every day | ORAL | 0 refills | Status: DC
Start: 2015-11-08 — End: 2016-02-01

## 2015-11-08 NOTE — Progress Notes (Signed)
S: c/o swollen painful r ankle, hx of gout in same area, was warm and couldn't walk on it this weekend, took some old allopurinol and ibuprofen which helped, no fever/chills or injury, states is having back surgery next week, cannot have anything with asa in it after 9/23  O: vitals wnl, nad, skin warm to touch, mild swelling, tenderness on medial malleolus, full rom, n/v intact,   A: gout  P: allopurinol, prednisone 30mg  qd x 3d

## 2015-11-10 ENCOUNTER — Telehealth: Payer: Self-pay | Admitting: Internal Medicine

## 2015-11-10 NOTE — Telephone Encounter (Signed)
Left pt a msg to call the office to sch flu shot.

## 2015-12-12 ENCOUNTER — Ambulatory Visit: Payer: Self-pay | Admitting: Physician Assistant

## 2015-12-12 ENCOUNTER — Encounter: Payer: Self-pay | Admitting: Physician Assistant

## 2015-12-12 VITALS — BP 140/90 | HR 84 | Temp 98.1°F

## 2015-12-12 DIAGNOSIS — R42 Dizziness and giddiness: Secondary | ICD-10-CM

## 2015-12-12 MED ORDER — MECLIZINE HCL 25 MG PO TABS: 25.0000 mg | ORAL_TABLET | Freq: Three times a day (TID) | ORAL | 0 refills | Status: DC | PRN

## 2015-12-12 NOTE — Progress Notes (Signed)
S: c/o dizziness this am, awoke with dizziness, when she laid back down the room was spinning and she had to vomit, feels better since she vomited, denies cp/sob, no headache, no visual changes, sx are improving, had surgery 3-4 weeks ago on her back, is only taking some muscle relaxers not pain medications because she doesn't hurt, denies pain in legs or swelling in legs, does have hx of vertigo but never had to vomit with it before  O: vitals wnl, nad, perrl eomi, tms clear, throat wnl, neck supple no lymph, lungs c t a, cv rrr ekg same as previously  A: dizziness  P: met b drawn today, antivert 63m

## 2015-12-13 LAB — BASIC METABOLIC PANEL
BUN / CREAT RATIO: 11 — AB (ref 12–28)
BUN: 11 mg/dL (ref 8–27)
CO2: 25 mmol/L (ref 18–29)
CREATININE: 1.01 mg/dL — AB (ref 0.57–1.00)
Calcium: 9.9 mg/dL (ref 8.7–10.3)
Chloride: 99 mmol/L (ref 96–106)
GFR, EST AFRICAN AMERICAN: 69 mL/min/{1.73_m2} (ref >59)
GFR, EST NON AFRICAN AMERICAN: 60 mL/min/{1.73_m2} (ref >59)
Glucose: 97 mg/dL (ref 65–99)
Potassium: 4.3 mmol/L (ref 3.5–5.2)
SODIUM: 139 mmol/L (ref 134–144)

## 2015-12-22 ENCOUNTER — Ambulatory Visit: Payer: Self-pay | Admitting: Family Medicine

## 2016-01-17 ENCOUNTER — Other Ambulatory Visit: Payer: Self-pay | Admitting: Family Medicine

## 2016-01-31 ENCOUNTER — Encounter: Payer: Self-pay | Admitting: Family

## 2016-01-31 NOTE — Progress Notes (Signed)
Subjective:    Patient ID: Lindsey Caldwell, female    DOB: 1956-01-29, 60 y.o.   MRN: [id number]  CC: Lindsey Caldwell is a 60 y.o. female who presents today for physical exam.    HPI: HTN- Compliant with medication Denies exertional chest pain or pressure, numbness or tingling radiating to left arm or jaw, palpitations, dizziness, frequent headaches, changes in vision, or shortness of breath.    GERD- Stable  Anxiety- Sometimes get upset or stressed from work; takes xanax average 7 times /month.  DM- Off medications. Averages 110 in the morning.     Colorectal Cancer Screening: UTD 2017; polpys. Negative for malignancy. 3 year schedule per patient.  Breast Cancer Screening: Mammogram UTD Cervical Cancer Screening: UTD 2016, negative malignancy and HPV. No pelvic complaints. dddde Bone Health screening/DEXA for 65+: No increased fracture risk. Defer screening at this time. Lung Cancer Screening: Doesn't have 30 year pack year history and age > 90 years. Quit 20 years ago.   Immunizations       Tetanus - UTD        Pneumococcal - Candidate for due to DM. Hepatitis C screening - Candidate for HIV Screening- Candidate for  Labs: Screening labs today. Exercise: Gets regular exercise.  Alcohol use: None Smoking/tobacco use: Former smoker.  Regular dental exams: UTD Wears seat belt: Yes. Skin- concerned for new mole under left breast. [facility] dermatologist.   HISTORY:  Past Medical History:  Diagnosis Date  . Anxiety   . Cholestanol storage disease   . Chronic kidney disease   . Diabetes mellitus without complication ([place])   . GERD (gastroesophageal reflux disease)   . High cholesterol   . Hypertension   . Sleep apnea    C-PAP    Past Surgical History:  Procedure Laterality Date  . BACK SURGERY  07/12/10   Dr. Arnoldo Morale  . COLONOSCOPY WITH PROPOFOL N/A 04/13/2015   Procedure: COLONOSCOPY WITH PROPOFOL;  Surgeon: Manya Silvas, MD;  Location: [facility] ENDOSCOPY;   Service: Endoscopy;  Laterality: N/A;  . DILATION AND CURETTAGE OF UTERUS    . HYSTEROSCOPY W/D&C N/A 07/11/2015   Procedure: DILATATION AND CURETTAGE /HYSTEROSCOPY;  Surgeon: Brayton Mars, MD;  Location: [facility] ORS;  Service: Gynecology;  Laterality: N/A;  . KNEE SURGERY Right 08/01/12   Dr. Marry Guan ,[facility]  . TUBAL LIGATION     x2   Family History  Problem Relation Age of Onset  . Cancer Father     Hodgkins  . Diabetes Sister   . Cancer Sister     brain tumor  . Cancer Sister     breast  . Breast cancer Sister 54  . Colon cancer Neg Hx   . Ovarian cancer Neg Hx       ALLERGIES: Gadolinium derivatives; Codeine; Iodine; and Percocet [oxycodone-acetaminophen]  Current Outpatient Prescriptions on File Prior to Visit  Medication Sig Dispense Refill  . allopurinol (ZYLOPRIM) 300 MG tablet Take 1 tablet (300 mg total) by mouth daily. 30 tablet 6  . ALPRAZolam (XANAX) 0.5 MG tablet Take 1 tablet (0.5 mg total) by mouth at bedtime as needed for anxiety. 90 tablet 1  . amLODipine (NORVASC) 5 MG tablet TAKE ONE (1) TABLET BY MOUTH EVERY DAY 90 tablet 0  . cyclobenzaprine (FLEXERIL) 10 MG tablet Take 10 mg by mouth 3 (three) times daily as needed for muscle spasms.    Marland Kitchen dexlansoprazole (DEXILANT) 60 MG capsule TAKE ONE (1) CAPSULE EACH DAY 30 capsule 5  .  lovastatin (MEVACOR) 20 MG tablet TAKE ONE TABLET DAILY AT BEDTIME 90 tablet 2  . meclizine (ANTIVERT) 25 MG tablet Take 1 tablet (25 mg total) by mouth 3 (three) times daily as needed for dizziness. 30 tablet 0  . montelukast (SINGULAIR) 10 MG tablet TAKE ONE TABLET DAILY AT BEDTIME (Patient taking differently: TAKE ONE TABLET DAILY) 90 tablet 2  . nystatin-triamcinolone ointment (MYCOLOG) Apply topically 2 (two) times daily. 60 g 3   No current facility-administered medications on file prior to visit.     Social History  Substance Use Topics  . Smoking status: Former Research scientist (life sciences)  . Smokeless tobacco: Never Used     Comment: quit 16  years ago   . Alcohol use No    Review of Systems  Constitutional: Negative for chills, fever and unexpected weight change.  HENT: Negative for congestion.   Respiratory: Negative for cough.   Cardiovascular: Negative for chest pain, palpitations and leg swelling.  Gastrointestinal: Negative for nausea and vomiting.  Musculoskeletal: Negative for arthralgias and myalgias.  Skin: Negative for rash.  Neurological: Negative for headaches.  Hematological: Negative for adenopathy.  Psychiatric/Behavioral: Negative for confusion.      Objective:    BP 126/80   Pulse 88   Temp 98 F (36.7 C) (Oral)   Ht 5\' 4"  (1.626 m)   Wt 259 lb 12.8 oz (117.8 kg)   SpO2 94%   BMI 44.59 kg/m   BP Readings from Last 3 Encounters:  02/01/16 126/80  12/12/15 140/90  11/08/15 139/78   Wt Readings from Last 3 Encounters:  02/01/16 259 lb 12.8 oz (117.8 kg)  10/20/15 251 lb 12.8 oz (114.2 kg)  09/21/15 251 lb (113.9 kg)    Physical Exam  Constitutional: She appears well-developed and well-nourished.  Eyes: Conjunctivae are normal.  Neck: No thyroid mass and no thyromegaly present.  Cardiovascular: Normal rate, regular rhythm, normal heart sounds and normal pulses.   Pulmonary/Chest: Effort normal and breath sounds normal. She has no wheezes. She has no rhonchi. She has no rales. Right breast exhibits no inverted nipple, no mass, no nipple discharge, no skin change and no tenderness. Left breast exhibits no inverted nipple, no mass, no nipple discharge, no skin change and no tenderness. Breasts are symmetrical.  CBE performed.   Lymphadenopathy:       Head (right side): No submental, no submandibular, no tonsillar, no preauricular, no posterior auricular and no occipital adenopathy present.       Head (left side): No submental, no submandibular, no tonsillar, no preauricular, no posterior auricular and no occipital adenopathy present.    She has no cervical adenopathy.       Right cervical: No  superficial cervical, no deep cervical and no posterior cervical adenopathy present.      Left cervical: No superficial cervical, no deep cervical and no posterior cervical adenopathy present.    She has no axillary adenopathy.  Neurological: She is alert.  Skin: Skin is warm and dry.  <2cm mole under left breast, brown, symmetric round.   Psychiatric: She has a normal mood and affect. Her speech is normal and behavior is normal. Thought content normal.  Vitals reviewed.      Assessment & Plan:   Problem List Items Addressed This Visit      Cardiovascular and Mediastinum   Hypertension    Well-controlled. Continue current regimen.      Relevant Medications   irbesartan (AVAPRO) 150 MG tablet     Digestive  Gastroesophageal reflux disease without esophagitis    Controlled. Continue current regimen.        Endocrine   Diabetes type 2, controlled ([place])    Diet controlled. Pending a1c      Relevant Medications   irbesartan (AVAPRO) 150 MG tablet   Other Relevant Orders   Microalbumin / creatinine urine ratio     Other   Routine general medical examination at a health care facility - Primary    Up-to-date on colonoscopy. Due 2020. mammogram up-to-date and Pap smear up-to-date.Deferred pelvic exam as no complaints. Does not meet criteria for lung cancer screening. She is up-to-date on tetanus vaccine. advised patient to have pneumococcal 23 done at her health care clinic. Screening labs including hepatitis C, HIV done today. Encouraged exercise. Referral placed dermatology for annual skin check as well as mole left breast.      Relevant Orders   CBC with Differential/Platelet   Comprehensive metabolic panel   Hemoglobin A1c   Hepatitis C antibody   HIV antibody   Lipid panel   TSH   VITAMIN D 25 Hydroxy (Vit-D Deficiency, Fractures)   Ambulatory referral to Dermatology   Anxiety state    Stable. Taking xanax 7/month. Discussed risks of BZDs and patient agreed with  long term plan of decreasing/discontinuing. New CSC.            I have discontinued Ms. Smolenski's predniSONE. I am also having her maintain her cyclobenzaprine, montelukast, lovastatin, nystatin-triamcinolone ointment, ALPRAZolam, dexlansoprazole, allopurinol, meclizine, amLODipine, and irbesartan.   Meds ordered this encounter  Medications  . irbesartan (AVAPRO) 150 MG tablet    Sig: TAKE ONE (1) TABLET BY MOUTH EVERY DAY    Dispense:  90 tablet    Refill:  3    Order Specific Question:   Supervising Provider    Answer:   Crecencio Mc [[id number]]    Return precautions given.   Risks, benefits, and alternatives of the medications and treatment plan prescribed today were discussed, and patient expressed understanding.   Education regarding symptom management and diagnosis given to patient on AVS.   Continue to follow with Mable Paris, FNP for routine health maintenance.   Gordana Cogswell and I agreed with plan.   Mable Paris, FNP

## 2016-02-01 ENCOUNTER — Ambulatory Visit (INDEPENDENT_AMBULATORY_CARE_PROVIDER_SITE_OTHER): Payer: Managed Care, Other (non HMO) | Admitting: Family

## 2016-02-01 ENCOUNTER — Encounter: Payer: Self-pay | Admitting: Family

## 2016-02-01 ENCOUNTER — Ambulatory Visit: Payer: Self-pay | Admitting: Physician Assistant

## 2016-02-01 VITALS — BP 126/80 | HR 88 | Temp 98.0°F | Ht 64.0 in | Wt 259.8 lb

## 2016-02-01 DIAGNOSIS — K219 Gastro-esophageal reflux disease without esophagitis: Secondary | ICD-10-CM

## 2016-02-01 DIAGNOSIS — E1121 Type 2 diabetes mellitus with diabetic nephropathy: Secondary | ICD-10-CM | POA: Diagnosis not present

## 2016-02-01 DIAGNOSIS — I10 Essential (primary) hypertension: Secondary | ICD-10-CM

## 2016-02-01 DIAGNOSIS — Z299 Encounter for prophylactic measures, unspecified: Secondary | ICD-10-CM

## 2016-02-01 DIAGNOSIS — Z0000 Encounter for general adult medical examination without abnormal findings: Secondary | ICD-10-CM | POA: Diagnosis not present

## 2016-02-01 DIAGNOSIS — F411 Generalized anxiety disorder: Secondary | ICD-10-CM

## 2016-02-01 LAB — LIPID PANEL
CHOL/HDL RATIO: 4
Cholesterol: 170 mg/dL (ref 0–200)
HDL: 38.6 mg/dL — AB (ref >39.00)
LDL CALC: 99 mg/dL (ref 0–99)
NONHDL: 131.47
Triglycerides: 163 mg/dL — ABNORMAL HIGH (ref 0.0–149.0)
VLDL: 32.6 mg/dL (ref 0.0–40.0)

## 2016-02-01 LAB — CBC WITH DIFFERENTIAL/PLATELET
BASOS PCT: 0.4 % (ref 0.0–3.0)
Basophils Absolute: 0 10*3/uL (ref 0.0–0.1)
EOS ABS: 0.2 10*3/uL (ref 0.0–0.7)
EOS PCT: 2.5 % (ref 0.0–5.0)
HCT: 35.5 % — ABNORMAL LOW (ref 36.0–46.0)
Hemoglobin: 11.5 g/dL — ABNORMAL LOW (ref 12.0–15.0)
LYMPHS ABS: 2.4 10*3/uL (ref 0.7–4.0)
Lymphocytes Relative: 24.6 % (ref 12.0–46.0)
MCHC: 32.5 g/dL (ref 30.0–36.0)
MCV: 81.4 fl (ref 78.0–100.0)
MONO ABS: 0.7 10*3/uL (ref 0.1–1.0)
Monocytes Relative: 7.2 % (ref 3.0–12.0)
NEUTROS PCT: 65.3 % (ref 43.0–77.0)
Neutro Abs: 6.3 10*3/uL (ref 1.4–7.7)
Platelets: 281 10*3/uL (ref 150.0–400.0)
RBC: 4.36 Mil/uL (ref 3.87–5.11)
RDW: 15.4 % (ref 11.5–15.5)
WBC: 9.7 10*3/uL (ref 4.0–10.5)

## 2016-02-01 LAB — COMPREHENSIVE METABOLIC PANEL
ALT: 16 U/L (ref 0–35)
AST: 17 U/L (ref 0–37)
Albumin: 4.1 g/dL (ref 3.5–5.2)
Alkaline Phosphatase: 107 U/L (ref 39–117)
BUN: 17 mg/dL (ref 6–23)
CHLORIDE: 104 meq/L (ref 96–112)
CO2: 29 meq/L (ref 19–32)
CREATININE: 1.14 mg/dL (ref 0.40–1.20)
Calcium: 9.9 mg/dL (ref 8.4–10.5)
GFR: 62.18 mL/min (ref >60.00)
GLUCOSE: 121 mg/dL — AB (ref 70–99)
POTASSIUM: 4.4 meq/L (ref 3.5–5.1)
SODIUM: 140 meq/L (ref 135–145)
Total Bilirubin: 0.3 mg/dL (ref 0.2–1.2)
Total Protein: 6.8 g/dL (ref 6.0–8.3)

## 2016-02-01 LAB — VITAMIN D 25 HYDROXY (VIT D DEFICIENCY, FRACTURES): VITD: 28.36 ng/mL — ABNORMAL LOW (ref 30.00–100.00)

## 2016-02-01 LAB — MICROALBUMIN / CREATININE URINE RATIO
CREATININE, U: 151.4 mg/dL
Microalb Creat Ratio: 5.2 mg/g (ref 0.0–30.0)
Microalb, Ur: 7.9 mg/dL — ABNORMAL HIGH (ref 0.0–1.9)

## 2016-02-01 LAB — HEMOGLOBIN A1C: Hgb A1c MFr Bld: 6.2 % (ref 4.6–6.5)

## 2016-02-01 LAB — TSH: TSH: 1.52 u[IU]/mL (ref 0.35–4.50)

## 2016-02-01 LAB — B12 AND FOLATE PANEL
Folate: 6.2 ng/mL (ref >5.9)
Vitamin B-12: 313 pg/mL (ref 211–911)

## 2016-02-01 MED ORDER — IRBESARTAN 150 MG PO TABS: ORAL_TABLET | ORAL | 3 refills | Status: DC

## 2016-02-01 NOTE — Assessment & Plan Note (Signed)
Up-to-date on colonoscopy. Due 2020. mammogram up-to-date and Pap smear up-to-date.Deferred pelvic exam as no complaints. Does not meet criteria for lung cancer screening. She is up-to-date on tetanus vaccine. advised patient to have pneumococcal 23 done at her health care clinic. Screening labs including hepatitis C, HIV done today. Encouraged exercise. Referral placed dermatology for annual skin check as well as mole left breast.

## 2016-02-01 NOTE — Progress Notes (Signed)
Patient came in to get her flu vaccine.

## 2016-02-01 NOTE — Assessment & Plan Note (Signed)
Well-controlled.  Continue current regimen. 

## 2016-02-01 NOTE — Patient Instructions (Addendum)
You need the pneumococcal 23 vaccine due to your diabetes. You may have this done at the clinic as we [place], Female Introduction Adopting a healthy lifestyle and getting preventive care can go a long way to promote health and wellness. Talk with your health care provider about what schedule of regular examinations is right for you. This is a good chance for you to check in with your provider about disease prevention and staying healthy. In between checkups, there are plenty of things you can do on your own. Experts have done a lot of research about which lifestyle changes and preventive measures are most likely to keep you healthy. Ask your health care provider for more information. Weight and diet Eat a healthy diet  Be sure to include plenty of vegetables, fruits, low-fat dairy products, and lean protein.  Do not eat a lot of foods high in solid fats, added sugars, or salt.  Get regular exercise. This is one of the most important things you can do for your health.  Most adults should exercise for at least 150 minutes each week. The exercise should increase your heart rate and make you sweat (moderate-intensity exercise).  Most adults should also do strengthening exercises at least twice a week. This is in addition to the moderate-intensity exercise. Maintain a healthy weight  Body mass index (BMI) is a measurement that can be used to identify possible weight problems. It estimates body fat based on height and weight. Your health care provider can help determine your BMI and help you achieve or maintain a healthy weight.  For females 7 years of age and older:  A BMI below 18.5 is considered underweight.  A BMI of 18.5 to 24.9 is normal.  A BMI of 25 to 29.9 is considered overweight.  A BMI of 30 and above is considered obese. Watch levels of cholesterol and blood lipids  You should start having your blood tested for lipids and cholesterol at 61 years of  age, then have this test every 5 years.  You may need to have your cholesterol levels checked more often if:  Your lipid or cholesterol levels are high.  You are older than 61 years of age.  You are at high risk for heart disease. Cancer screening Lung Cancer  Lung cancer screening is recommended for adults 58-50 years old who are at high risk for lung cancer because of a history of smoking.  A yearly low-dose CT scan of the lungs is recommended for people who:  Currently smoke.  Have quit within the past 15 years.  Have at least a 30-pack-year history of smoking. A pack year is smoking an average of one pack of cigarettes a day for 1 year.  Yearly screening should continue until it has been 15 years since you quit.  Yearly screening should stop if you develop a health problem that would prevent you from having lung cancer treatment. Breast Cancer  Practice breast self-awareness. This means understanding how your breasts normally appear and feel.  It also means doing regular breast self-exams. Let your health care provider know about any changes, no matter how small.  If you are in your 20s or 30s, you should have a clinical breast exam (CBE) by a health care provider every 1-3 years as part of a regular health exam.  If you are 80 or older, have a CBE every year. Also consider having a breast X-ray (mammogram) every year.  If you have a family history  of breast cancer, talk to your health care provider about genetic screening.  If you are at high risk for breast cancer, talk to your health care provider about having an MRI and a mammogram every year.  Breast cancer gene (BRCA) assessment is recommended for women who have family members with BRCA-related cancers. BRCA-related cancers include:  Breast.  Ovarian.  Tubal.  Peritoneal cancers.  Results of the assessment will determine the need for genetic counseling and BRCA1 and BRCA2 testing. Cervical Cancer  Your  health care provider may recommend that you be screened regularly for cancer of the pelvic organs (ovaries, uterus, and vagina). This screening involves a pelvic examination, including checking for microscopic changes to the surface of your cervix (Pap test). You may be encouraged to have this screening done every 3 years, beginning at age 23.  For women ages 40-65, health care providers may recommend pelvic exams and Pap testing every 3 years, or they may recommend the Pap and pelvic exam, combined with testing for human papilloma virus (HPV), every 5 years. Some types of HPV increase your risk of cervical cancer. Testing for HPV may also be done on women of any age with unclear Pap test results.  Other health care providers may not recommend any screening for nonpregnant women who are considered low risk for pelvic cancer and who do not have symptoms. Ask your health care provider if a screening pelvic exam is right for you.  If you have had past treatment for cervical cancer or a condition that could lead to cancer, you need Pap tests and screening for cancer for at least 20 years after your treatment. If Pap tests have been discontinued, your risk factors (such as having a new sexual partner) need to be reassessed to determine if screening should resume. Some women have medical problems that increase the chance of getting cervical cancer. In these cases, your health care provider may recommend more frequent screening and Pap tests. Colorectal Cancer  This type of cancer can be detected and often prevented.  Routine colorectal cancer screening usually begins at 61 years of age and continues through 61 years of age.  Your health care provider may recommend screening at an earlier age if you have risk factors for colon cancer.  Your health care provider may also recommend using home test kits to check for hidden blood in the stool.  A small camera at the end of a tube can be used to examine your  colon directly (sigmoidoscopy or colonoscopy). This is done to check for the earliest forms of colorectal cancer.  Routine screening usually begins at age 78.  Direct examination of the colon should be repeated every 5-10 years through 61 years of age. However, you may need to be screened more often if early forms of precancerous polyps or small growths are found. Skin Cancer  Check your skin from head to toe regularly.  Tell your health care provider about any new moles or changes in moles, especially if there is a change in a mole's shape or color.  Also tell your health care provider if you have a mole that is larger than the size of a pencil eraser.  Always use sunscreen. Apply sunscreen liberally and repeatedly throughout the day.  Protect yourself by wearing long sleeves, pants, a wide-brimmed hat, and sunglasses whenever you are outside. Heart disease, diabetes, and high blood pressure  High blood pressure causes heart disease and increases the risk of stroke. High blood pressure is  more likely to develop in:  People who have blood pressure in the high end of the normal range (130-139/85-89 mm Hg).  People who are overweight or obese.  People who are African American.  If you are 73-30 years of age, have your blood pressure checked every 3-5 years. If you are 18 years of age or older, have your blood pressure checked every year. You should have your blood pressure measured twice-once when you are at a hospital or clinic, and once when you are not at a hospital or clinic. Record the average of the two measurements. To check your blood pressure when you are not at a hospital or clinic, you can use:  An automated blood pressure machine at a pharmacy.  A home blood pressure monitor.  If you are between 62 years and 18 years old, ask your health care provider if you should take aspirin to prevent strokes.  Have regular diabetes screenings. This involves taking a blood sample to  check your fasting blood sugar level.  If you are at a normal weight and have a low risk for diabetes, have this test once every three years after 61 years of age.  If you are overweight and have a high risk for diabetes, consider being tested at a younger age or more often. Preventing infection Hepatitis B  If you have a higher risk for hepatitis B, you should be screened for this virus. You are considered at high risk for hepatitis B if:  You were born in a country where hepatitis B is common. Ask your health care provider which countries are considered high risk.  Your parents were born in a high-risk country, and you have not been immunized against hepatitis B (hepatitis B vaccine).  You have HIV or AIDS.  You use needles to inject street drugs.  You live with someone who has hepatitis B.  You have had sex with someone who has hepatitis B.  You get hemodialysis treatment.  You take certain medicines for conditions, including cancer, organ transplantation, and autoimmune conditions. Hepatitis C  Blood testing is recommended for:  Everyone born from [birth year] through 1966.  Anyone with known risk factors for hepatitis C. Sexually transmitted infections (STIs)  You should be screened for sexually transmitted infections (STIs) including gonorrhea and chlamydia if:  You are sexually active and are younger than 61 years of age.  You are older than 61 years of age and your health care provider tells you that you are at risk for this type of infection.  Your sexual activity has changed since you were last screened and you are at an increased risk for chlamydia or gonorrhea. Ask your health care provider if you are at risk.  If you do not have HIV, but are at risk, it may be recommended that you take a prescription medicine daily to prevent HIV infection. This is called pre-exposure prophylaxis (PrEP). You are considered at risk if:  You are sexually active and do not regularly use  condoms or know the HIV status of your partner(s).  You take drugs by injection.  You are sexually active with a partner who has HIV. Talk with your health care provider about whether you are at high risk of being infected with HIV. If you choose to begin PrEP, you should first be tested for HIV. You should then be tested every 3 months for as long as you are taking PrEP. Pregnancy  If you are premenopausal and you may become pregnant,  ask your health care provider about preconception counseling.  If you may become pregnant, take 400 to 800 micrograms (mcg) of folic acid every day.  If you want to prevent pregnancy, talk to your health care provider about birth control (contraception). Osteoporosis and menopause  Osteoporosis is a disease in which the bones lose minerals and strength with aging. This can result in serious bone fractures. Your risk for osteoporosis can be identified using a bone density scan.  If you are 68 years of age or older, or if you are at risk for osteoporosis and fractures, ask your health care provider if you should be screened.  Ask your health care provider whether you should take a calcium or vitamin D supplement to lower your risk for osteoporosis.  Menopause may have certain physical symptoms and risks.  Hormone replacement therapy may reduce some of these symptoms and risks. Talk to your health care provider about whether hormone replacement therapy is right for you. Follow these instructions at home:  Schedule regular health, dental, and eye exams.  Stay current with your immunizations.  Do not use any tobacco products including cigarettes, chewing tobacco, or electronic cigarettes.  If you are pregnant, do not drink alcohol.  If you are breastfeeding, limit how much and how often you drink alcohol.  Limit alcohol intake to no more than 1 drink per day for nonpregnant women. One drink equals 12 ounces of beer, 5 ounces of wine, or 1 ounces of  hard liquor.  Do not use street drugs.  Do not share needles.  Ask your health care provider for help if you need support or information about quitting drugs.  Tell your health care provider if you often feel depressed.  Tell your health care provider if you have ever been abused or do not feel safe at home. This information is not intended to replace advice given to you by your health care provider. Make sure you discuss any questions you have with your health care provider. Document Released: 08/21/2010 Document Revised: 07/14/2015 Document Reviewed: 11/09/2014  2017 Elsevier

## 2016-02-01 NOTE — Progress Notes (Signed)
Pre visit review using our clinic review tool, if applicable. No additional management support is needed unless otherwise documented below in the visit note. 

## 2016-02-01 NOTE — Assessment & Plan Note (Signed)
Diet controlled. Pending a1c ?

## 2016-02-01 NOTE — Assessment & Plan Note (Signed)
Stable. Taking xanax 7/month. Discussed risks of BZDs and patient agreed with long term plan of decreasing/discontinuing. New CSC.

## 2016-02-01 NOTE — Assessment & Plan Note (Signed)
Controlled. Continue current regimen. 

## 2016-02-02 ENCOUNTER — Other Ambulatory Visit: Payer: Self-pay | Admitting: Family

## 2016-02-02 ENCOUNTER — Telehealth: Payer: Self-pay | Admitting: Family

## 2016-02-02 DIAGNOSIS — E785 Hyperlipidemia, unspecified: Secondary | ICD-10-CM

## 2016-02-02 LAB — HEPATITIS C ANTIBODY: HCV AB: NEGATIVE

## 2016-02-02 LAB — HIV ANTIBODY (ROUTINE TESTING W REFLEX): HIV 1&2 Ab, 4th Generation: NONREACTIVE

## 2016-02-02 MED ORDER — LOVASTATIN 40 MG PO TABS: 20.0000 mg | ORAL_TABLET | Freq: Every day | ORAL | 3 refills | Status: AC

## 2016-02-02 NOTE — Telephone Encounter (Signed)
Pt called and asked if you could please mail her a copy of her lab work. Thank you!  Address - [address]  [id number]

## 2016-02-03 NOTE — Telephone Encounter (Signed)
Results have been mailed

## 2016-02-23 ENCOUNTER — Ambulatory Visit: Payer: Self-pay | Admitting: Physician Assistant

## 2016-03-22 ENCOUNTER — Telehealth: Payer: Self-pay | Admitting: Family

## 2016-03-22 MED ORDER — MONTELUKAST SODIUM 10 MG PO TABS: 10.0000 mg | ORAL_TABLET | Freq: Every day | ORAL | 2 refills | Status: DC

## 2016-03-22 MED ORDER — MONTELUKAST SODIUM 10 MG PO TABS: 10.0000 mg | ORAL_TABLET | Freq: Every day | ORAL | 2 refills | Status: AC

## 2016-03-22 NOTE — Telephone Encounter (Signed)
montelukast (SINGULAIR) 10 MG tablet take one tablet by mouth every night at bedtime. Qty 90.

## 2016-03-22 NOTE — Telephone Encounter (Signed)
Medication has been refilled and sent to [facility].

## 2016-04-18 ENCOUNTER — Other Ambulatory Visit: Payer: Self-pay | Admitting: Family Medicine

## 2016-04-18 ENCOUNTER — Other Ambulatory Visit: Payer: Self-pay | Admitting: Family

## 2016-04-18 DIAGNOSIS — K219 Gastro-esophageal reflux disease without esophagitis: Secondary | ICD-10-CM

## 2016-04-18 MED ORDER — DEXLANSOPRAZOLE 30 MG PO CPDR: 30.0000 mg | DELAYED_RELEASE_CAPSULE | Freq: Every day | ORAL | 1 refills | Status: DC

## 2016-04-18 NOTE — Telephone Encounter (Signed)
Call pt refilled dexilant She was actually a dose that is for acute GERD.   Much safer to be on approved maintenance dose of 30mg  to control sxs. Made that change to rx.

## 2016-04-18 NOTE — Telephone Encounter (Signed)
Last OV 02/01/16 last filled by Dr.Sonnenberg 10/11/15 30 5rf

## 2016-04-19 ENCOUNTER — Other Ambulatory Visit: Payer: Self-pay | Admitting: Family

## 2016-04-19 DIAGNOSIS — K219 Gastro-esophageal reflux disease without esophagitis: Secondary | ICD-10-CM

## 2016-04-19 NOTE — Telephone Encounter (Signed)
Pharmacy called and stated that they received an rx for this medication but the pt has been taking 60 mg. So they needed a clarification.  Pharmacy - [facility], [place]

## 2016-04-20 NOTE — Telephone Encounter (Signed)
Pt called needing a refill on the medication.  Pharmacy is amLODipine (NORVASC) 5 MG tablet  Call pt @ [phone]. Thank you!

## 2016-04-23 NOTE — Telephone Encounter (Signed)
Pt called back and stated that she has no more medication and that she has been taking 60mg  not 30 mg. Please call pt when completed, thank you!  Call pt @ [phone]

## 2016-04-23 NOTE — Telephone Encounter (Signed)
Confused. Which medication ?

## 2016-04-23 NOTE — Telephone Encounter (Signed)
Was the rx supposed to be written for 30mg  or the 60mg ? Pt is out of the medication.  Please advise.

## 2016-04-24 NOTE — Telephone Encounter (Signed)
Pt stated that the medication is the dexilant 60mg , but the last rx was sent in for 30mg . Is it supposed to be 60mg  or 30mg ? Please advise.

## 2016-04-24 NOTE — Telephone Encounter (Signed)
Pt stated that she would be willing to try the 30mg  dose. Explained to the pt that if it does not work then to let us know and would send the 60mg  dose back in. Pt was in agreement.

## 2016-04-24 NOTE — Telephone Encounter (Signed)
The 30 mg is the maintenance dose.   60 mg is for active ulcer/gastritis for which it is recommended that you are on for 8 weeks.   Safer to be on lowest effective dose   Would she agree to try the 30mg ?

## 2016-04-24 NOTE — Telephone Encounter (Signed)
LMTCB

## 2016-04-24 NOTE — Telephone Encounter (Signed)
LMTCB at number provided below.

## 2016-04-24 NOTE — Telephone Encounter (Signed)
Pt called back returning your call.   Call pt @ [phone]. Thank you!

## 2016-04-25 ENCOUNTER — Ambulatory Visit: Payer: Self-pay | Admitting: Physician Assistant

## 2016-04-25 VITALS — BP 120/80 | HR 89 | Temp 98.4°F

## 2016-04-25 DIAGNOSIS — M7552 Bursitis of left shoulder: Secondary | ICD-10-CM

## 2016-04-25 MED ORDER — RANITIDINE HCL 150 MG PO TABS: 150.0000 mg | ORAL_TABLET | Freq: Two times a day (BID) | ORAL | 3 refills | Status: DC

## 2016-04-25 MED ORDER — METHYLPREDNISOLONE 4 MG PO TBPK: ORAL_TABLET | ORAL | 0 refills | Status: DC

## 2016-04-25 MED ORDER — DICLOFENAC SODIUM 1 % TD GEL: 2.0000 g | Freq: Four times a day (QID) | TRANSDERMAL | 6 refills | Status: DC

## 2016-04-25 NOTE — Progress Notes (Signed)
S: c/o left sided shoulder pain, increased with movement, similar sx in past and it was bursitis, no fever/chills/cp/sob; also concerned about her dexilant, her new provider told her she needed to only be on 30mg  but has been on 60 for many years, hasn't started on the medication yet bc she ran out, remainder ros neg  O: vitals wnl, nad, neck supple no lymph, left shoulder tender in joint and along bicep tendon, decreased rom with overhead reach and across chest, grips = b/l, n/v intact, lungs c t a, cv rrr

## 2016-06-22 ENCOUNTER — Encounter: Payer: Self-pay | Admitting: Physician Assistant

## 2016-06-22 ENCOUNTER — Ambulatory Visit: Payer: Self-pay | Admitting: Physician Assistant

## 2016-06-22 VITALS — BP 148/80 | HR 104 | Temp 98.6°F

## 2016-06-22 DIAGNOSIS — J0100 Acute maxillary sinusitis, unspecified: Secondary | ICD-10-CM

## 2016-06-22 MED ORDER — FLUTICASONE PROPIONATE 50 MCG/ACT NA SUSP: 2.0000 | Freq: Every day | NASAL | 6 refills | Status: DC

## 2016-06-22 MED ORDER — AZITHROMYCIN 250 MG PO TABS
ORAL_TABLET | ORAL | 0 refills | Status: DC
Start: 2016-06-22 — End: 2016-08-07

## 2016-06-22 NOTE — Progress Notes (Signed)
S: C/o runny nose and congestion for 14 days, no fever, chills, cp/sob, v/d; mucus is yellow and thick, cough is sporadic, c/o of facial and dental pain.   Using otc meds:   O: PE: vitals wnl, nad, perrl eomi, normocephalic, tms dull, nasal mucosa red and swollen, throat injected, neck supple no lymph, lungs c t a, cv rrr, neuro intact  A:  Acute sinusitis   P: drink fluids, continue regular meds , use otc meds of choice, return if not improving in 5 days, return earlier if worsening, zpack, flonase

## 2016-07-06 ENCOUNTER — Telehealth: Payer: Self-pay | Admitting: Emergency Medicine

## 2016-07-06 MED ORDER — FLUCONAZOLE 150 MG PO TABS: ORAL_TABLET | ORAL | 0 refills | Status: DC

## 2016-07-06 MED ORDER — AMOXICILLIN 875 MG PO TABS: 875.0000 mg | ORAL_TABLET | Freq: Two times a day (BID) | ORAL | 0 refills | Status: DC

## 2016-07-06 NOTE — Telephone Encounter (Signed)
Patient called and expressed that she is not any better and she finished the zpack.

## 2016-07-06 NOTE — Telephone Encounter (Signed)
Pt not better, says zpack didn't help at all, sent rx for amoxil to [facility], diflucan also in case of yeast

## 2016-08-07 ENCOUNTER — Ambulatory Visit: Payer: Self-pay | Admitting: Physician Assistant

## 2016-08-07 ENCOUNTER — Encounter: Payer: Self-pay | Admitting: Physician Assistant

## 2016-08-07 VITALS — BP 150/80 | HR 94 | Temp 98.5°F

## 2016-08-07 DIAGNOSIS — R42 Dizziness and giddiness: Secondary | ICD-10-CM

## 2016-08-07 MED ORDER — MECLIZINE HCL 25 MG PO TABS: 25.0000 mg | ORAL_TABLET | Freq: Three times a day (TID) | ORAL | 0 refills | Status: DC | PRN

## 2016-08-07 NOTE — Progress Notes (Signed)
S: c/o dizziness for a few days, states on Friday 4 days ago she got really dizzy when she laid down on the bed, felt like the bed was moving around and around, got sweaty at the time, denies cp/sob, still feeling dizzy, worse when she moves her head quickly, similar sx in recent past, did start taking antivert on Sunday, has not been sweaty since first episode  O: vitals wnl, nad, perrl eomi, no nystagmus noted, tms clear, throat wnl, neck supple, no carotid bruits noted, lungs c t a, cv rrr, ekgwith no change from previous ekg  A: dizziness  P: antivert, f/u with pcp

## 2016-10-24 ENCOUNTER — Other Ambulatory Visit: Payer: Self-pay | Admitting: Family Medicine

## 2016-10-24 DIAGNOSIS — Z1231 Encounter for screening mammogram for malignant neoplasm of breast: Secondary | ICD-10-CM

## 2016-10-31 ENCOUNTER — Ambulatory Visit
Admission: RE | Admit: 2016-10-31 | Discharge: 2016-10-31 | Disposition: A | Discharge status: Home / Self Care | Payer: Managed Care, Other (non HMO) | Source: Ambulatory Visit | Attending: Family Medicine | Admitting: Family Medicine

## 2016-10-31 DIAGNOSIS — Z1231 Encounter for screening mammogram for malignant neoplasm of breast: Secondary | ICD-10-CM | POA: Insufficient documentation

## 2016-11-05 ENCOUNTER — Encounter: Payer: Self-pay | Admitting: Family

## 2016-11-05 NOTE — Progress Notes (Unsigned)
Abstracted Microalbumin from [facility] in chart Care every where.

## 2016-12-17 ENCOUNTER — Ambulatory Visit: Payer: Self-pay | Admitting: Physician Assistant

## 2016-12-17 ENCOUNTER — Encounter: Payer: Self-pay | Admitting: Physician Assistant

## 2016-12-17 VITALS — BP 130/80 | HR 87 | Temp 98.5°F | Resp 16

## 2016-12-17 DIAGNOSIS — M79641 Pain in right hand: Secondary | ICD-10-CM

## 2016-12-17 DIAGNOSIS — Z299 Encounter for prophylactic measures, unspecified: Secondary | ICD-10-CM

## 2016-12-17 NOTE — Progress Notes (Signed)
S: states she is having r hand pain, states she can't uncurl her fingers in the mornings, doesn't know if she is clenching her fist her not at night, no numbness or tingling, no known injury  O: vitals wnl, nad, hand is not tender to palp, full rom of hand and fingers, n/v intact  A: hand pain, ?tendon issue  P: cock up wrist splint to wear at night, if not better in 2-3 weeks then refer to ortho

## 2017-01-04 ENCOUNTER — Encounter: Payer: Self-pay | Admitting: Physician Assistant

## 2017-01-04 ENCOUNTER — Ambulatory Visit: Payer: Self-pay | Admitting: Physician Assistant

## 2017-01-04 VITALS — BP 170/90 | HR 89 | Temp 98.5°F | Resp 16

## 2017-01-04 DIAGNOSIS — I10 Essential (primary) hypertension: Secondary | ICD-10-CM

## 2017-01-04 MED ORDER — AMLODIPINE BESYLATE 5 MG PO TABS: 10.0000 mg | ORAL_TABLET | Freq: Every day | ORAL | 3 refills | Status: AC

## 2017-01-04 NOTE — Progress Notes (Signed)
S: Complains of increased blood pressure, states she is under a lot of stress due to her mother being put in a nursing facility, there are a lot of family stressors involved, states her blood pressure has always been good with the medication she is on, denies chest pain or shortness of breath, does states she's had a little bit of a headache, and is seeing floaters  O: Vitals with blood pressure of 170/90, lungs are clear to auscultation, heart sounds are normal  A: Hypertension with elevated blood pressure  P: Patient is instructed to increase her amlodipine from 5 mg to 10 mg, a metabolic panel was ordered to assess kidney function, will recheck blood pressure next Wednesday, if the patient is worsening she is to go the emergency department

## 2017-01-05 LAB — BASIC METABOLIC PANEL
BUN / CREAT RATIO: 14 (ref 12–28)
BUN: 16 mg/dL (ref 8–27)
CALCIUM: 9.6 mg/dL (ref 8.7–10.3)
CHLORIDE: 101 mmol/L (ref 96–106)
CO2: 25 mmol/L (ref 20–29)
Creatinine, Ser: 1.15 mg/dL — ABNORMAL HIGH (ref 0.57–1.00)
GFR calc non Af Amer: 51 mL/min/{1.73_m2} — ABNORMAL LOW (ref >59)
GFR, EST AFRICAN AMERICAN: 59 mL/min/{1.73_m2} — AB (ref >59)
GLUCOSE: 122 mg/dL — AB (ref 65–99)
Potassium: 4 mmol/L (ref 3.5–5.2)
Sodium: 140 mmol/L (ref 134–144)

## 2017-01-07 NOTE — Progress Notes (Signed)
Send to Shirrell's regular doctor, concerns of kidney function with recently elevated blood pressure

## 2017-01-09 ENCOUNTER — Ambulatory Visit: Payer: Self-pay | Admitting: Physician Assistant

## 2017-01-09 VITALS — BP 140/98 | HR 90 | Temp 98.5°F | Resp 16

## 2017-01-09 DIAGNOSIS — I10 Essential (primary) hypertension: Secondary | ICD-10-CM

## 2017-01-09 NOTE — Progress Notes (Signed)
S: Patient is here for blood pressure check, her blood pressure has recently become elevated due to family stressors, we increased her medication of amlodipine to 2 pills a day, patient has started walking and exercising again, some of the stress is decreased family, has a physical in December with her primary care provider, her recent labs for met B showed a decreased kidney function similar to her previous labs  O: Vitals with a blood pressure of 140/98, patient is in no acute distress, appears less stress than on last visit, lungs clear to auscultation, heart sounds are normal  A: Hypertension  P: Continue amlodipine 10 mg a day, we will send the kidney function results to her physician, she is to see her in December, return to the clinic if worsening

## 2017-02-04 ENCOUNTER — Ambulatory Visit: Payer: Self-pay | Admitting: Emergency Medicine

## 2017-02-04 VITALS — BP 139/80 | HR 93 | Temp 98.7°F | Resp 16

## 2017-02-04 DIAGNOSIS — M25541 Pain in joints of right hand: Secondary | ICD-10-CM

## 2017-02-04 NOTE — Progress Notes (Signed)
S: Right hand swollen and painful.  No history of injury.  Patient states this is been going on for more than 1 month.  She wakes with her hand stiff.  She states it improves with putting her hand in a "hot water".  She has not been taking any over-the-counter medication for this. O: On examination of the hand there is some tenderness on palpation of the MCP joint third and fourth digit.  No erythema or warmth is noted.  Patient is able to move digits without any difficulty.  Motor sensory function intact.  Skin intact.  Capillary refill is less than 3 seconds. A: Right hand arthralgia. P: Meloxicam 15 mg 1 daily with food #30 with 1 refill.

## 2017-04-17 ENCOUNTER — Other Ambulatory Visit: Payer: Self-pay | Admitting: Emergency Medicine

## 2017-06-05 ENCOUNTER — Ambulatory Visit: Payer: Self-pay | Admitting: Family Medicine

## 2017-06-05 VITALS — BP 139/89 | HR 79 | Temp 98.6°F | Resp 18

## 2017-06-05 DIAGNOSIS — R238 Other skin changes: Secondary | ICD-10-CM

## 2017-06-05 NOTE — Progress Notes (Signed)
Subjective: blister     Lindsey Caldwell is a 62 y.o. female who presents for evaluation of a blister involving the top of her left great toe.  Patient reports that 5 days ago part of a table landed on the top of her left great toe causing distal toe pain.  Patient reports developing small amount of bruising and two small blisters shortly after that.  Patient reports that the size and appearance of the blisters has been improving significantly in the last 5 days and that the bruising has gone away.  Denies any pain related to the blister.  Patient reports a small amount of spontaneous drainage of clear fluid at home.  Denies purulent discharge or odor.  Denies fever, chills, malaise, fatigue, arthralgia, or anorexia.  Denies any other symptoms or complaints.  Patient denies any previous infections to her feet, blisters to her feet, or any history of peripheral neuropathy.  Patient reports a history of type 2 diabetes but that she has been able to come off of all of her medications due to good glycemic control with diet.  Last A1c was 6.1.    Review of Systems Pertinent items noted in HPI and remainder of comprehensive ROS otherwise negative.    Objective:   General:  alert, cooperative, appears stated age and no distress.  Afebrile.  Skin:   Normal with the exception of a small bulla at the superior lateral aspect of her left hallux  measuring approximately 3/4 cm in diameter.  Clear fluid present.  No erythema, edema, warmth to the touch or tenderness to palpation.  Another small  vesicle adjacent to this, located just medial on top of the left hallux.  No erythema, edema, warmth to the touch or tenderness to this vesicle.  Clear fluid present.  No purulent fluid, discharge or odor noted.   No lymphadenopathy present.  Distal pulses palpable.  Normal/equal sensation in feet bilaterally.  Normal ROM to left hallux.  Left hallux without signs of fracture, no edema, ecchymosis, or deformity  noted.  Assessment:   Left hallux bullae   Plan:   Discussed the importance of regular visits with podiatry and monitoring her feet for any abnormalities or infection. No symptoms suggestive of infection.  Advised patient to call her podiatrist today to schedule a follow-up appointment in 2 days.  Advised her to follow-up with her PCP or return to the clinic if she is unable to see her podiatrist within 2 days for follow-up. Discussed ways to prevent friction and reduce pressure to her left great toe.  Discussed prevention of infection and home care. Red flag symptoms and indications to seek medical care discussed.

## 2017-06-14 DIAGNOSIS — R768 Other specified abnormal immunological findings in serum: Secondary | ICD-10-CM | POA: Insufficient documentation

## 2017-06-14 DIAGNOSIS — M79641 Pain in right hand: Secondary | ICD-10-CM | POA: Insufficient documentation

## 2017-06-28 ENCOUNTER — Ambulatory Visit: Payer: Self-pay | Admitting: Family Medicine

## 2017-06-28 VITALS — BP 142/88 | HR 74 | Temp 98.6°F | Resp 16

## 2017-06-28 DIAGNOSIS — J309 Allergic rhinitis, unspecified: Secondary | ICD-10-CM

## 2017-06-28 MED ORDER — FLUTICASONE PROPIONATE 50 MCG/ACT NA SUSP: 2.0000 | Freq: Every day | NASAL | 1 refills | Status: DC

## 2017-06-28 NOTE — Progress Notes (Signed)
Subjective: Headache, "allergies" HPI:  Lindsey Caldwell is a 62 y.o. female who presents for evaluation of headache, rhinorrhea with clear nasal  drainage, itchy/watery eyes, and sneezing for 5 days. Denies fevers, chills, body aches, sore throat, shortness of breath, malaise, fatigue, cough, purulent nasal discharge, facial pressure, halitosis, any other systemic or infectious symptoms.  Patient has a history of seasonal allergic rhinitis and reports recent worsening of her symptoms.  Patient takes montelukast for this but states "I do not think it is doing the trick anymore".  Patient reports she used Flonase in the past but ran out of this a while ago. Patient endorses muffled hearing/pressure in her ears bilaterally.  Denies any significant history of ear problems or surgeries in the past.  Patient reports that when she blows her nose she sometimes sees a very small amount of bright red blood-tinged drainage mixed in with clear nasal discharge.  Denies any relevant history related to this.  Patient has a history of hypertension but her blood pressure is 142/88 today.  Patient denies any visual or neurologic symptoms. Treatment to date: Montelukast.  Denies any other symptoms or complaints.   Review of Systems Pertinent items noted in HPI and remainder of comprehensive ROS otherwise negative.     Objective:   Physical Exam General: Awake, alert, and oriented. No acute distress. Well developed, hydrated and nourished. Appears stated age. Nontoxic appearance.  HEENT: Clear PND noted.  No erythema to posterior oropharynx.  No edema or exudates of pharynx or tonsils. No erythema, opaque fluid, or bulging of TM.  Air/fluid level to bilateral TMs.  Otherwise normal TMs. Pale, boggy nasal mucosa. Sinuses nontender. Supple neck without adenopathy. Cardiac: Heart rate and rhythm are normal. No murmurs, gallops, or rubs are auscultated. S1 and S2 are heard and are of normal intensity.  Respiratory: No signs of  respiratory distress. Lungs clear. No tachypnea. Able to speak in full sentences without dyspnea. Nonlabored respirations.   Skin: Skin is warm, dry and intact. Appropriate color for ethnicity. No cyanosis noted.    Assessment:   Eustachian Tube Dysfunction Allergic Rhinitis   Plan:   Prescribed Flonase and educated patient regarding dosage and instructions for use.  Patient has taken this in the past and tolerated this well.  Discussed side/adverse effects.  Denies any ocular disorders. Advised patient to apply Vaseline to the inside her nose if she experiences bleeding or irritation.  Discussed humidifier use. BP 142/88 today.  Advised patient to monitor this at home and report abnormal values to her primary care provider. Discussed red flag symptoms and circumstances with which to return to care.   New Prescriptions   FLUTICASONE (FLONASE) 50 MCG/ACT NASAL SPRAY    Place 2 sprays into both nostrils daily.

## 2017-07-04 DIAGNOSIS — M159 Polyosteoarthritis, unspecified: Secondary | ICD-10-CM | POA: Insufficient documentation

## 2017-07-26 ENCOUNTER — Telehealth: Payer: Self-pay | Admitting: Family

## 2017-07-26 DIAGNOSIS — K219 Gastro-esophageal reflux disease without esophagitis: Secondary | ICD-10-CM

## 2017-07-26 NOTE — Telephone Encounter (Signed)
Copied from [place] [id number]. Topic: Quick Communication - Rx Refill/Question >> Jul 26, 2017 11:52 AM Percell Belt A wrote: Medication: DEXILANT 30 MG capsule [[id number]]   Has the patient contacted their pharmacy? Yes  (Agent: If no, request that the patient contact the pharmacy for the refill.) (Agent: If yes, when and what did the pharmacy advise?)  Preferred Pharmacy (with phone number or street name): [facility] - new pharmacy   Agent: Please be advised that RX refills may take up to 3 business days. We ask that you follow-up with your pharmacy.

## 2017-09-05 ENCOUNTER — Ambulatory Visit: Payer: Self-pay | Admitting: Family Medicine

## 2017-09-05 VITALS — BP 148/92 | HR 97 | Temp 98.2°F | Resp 20

## 2017-09-05 DIAGNOSIS — R21 Rash and other nonspecific skin eruption: Secondary | ICD-10-CM

## 2017-09-05 DIAGNOSIS — L299 Pruritus, unspecified: Secondary | ICD-10-CM

## 2017-09-05 MED ORDER — CETIRIZINE HCL 10 MG PO TABS
10.0000 mg | ORAL_TABLET | Freq: Every day | ORAL | 0 refills | Status: DC | PRN
Start: 2017-09-05 — End: 2018-04-14

## 2017-09-05 MED ORDER — TRIAMCINOLONE ACETONIDE 0.1 % EX CREA: TOPICAL_CREAM | CUTANEOUS | 0 refills | Status: DC

## 2017-09-05 NOTE — Progress Notes (Signed)
Subjective: "ant bites"     Lindsey Caldwell is a 62 y.o. female who presents for evaluation of a rash involving the superior aspects of her feet and lower legs bilaterally. Rash started a little less than 2 weeks ago.  Patient attended an outdoor funeral in high grass and weeds and reports that she was bitten by multiple fire ants.  Reports onset of papules and pruritus shortly after this.  Lesions are pink, and raised in texture. Rash has improved over time. Rash is pruritic. Associated symptoms: none. Patient denies: abdominal pain, arthralgia, myalgia, congestion, cough, decrease in appetite, decrease in energy level, fever, chills, headache, irritability, nausea, confusion, sore throat, or vomiting.  Denies any systemic symptoms at all.  Patient has not had contacts with similar rash, except her sister-in-law who was at the funeral with her and also was bitten by fire ants.  Both visualized the ants on their legs.  No household contacts with rash or pruritus.  Patient has not had any other new exposures (soaps, lotions, laundry detergents, foods, medications, plants, insects or animals).  Denies recent travel or staying in hotels. Denies any relevant dermatologic history.  Denies recent tick exposure. Treatment attempted at home: Cortisone 10 cream, Benadryl, and rubbing alcohol.  Review of Systems Pertinent items noted in HPI and remainder of comprehensive ROS otherwise negative.    Objective:    BP (!) 148/92   Pulse 97   Temp 98.2 F (36.8 C) (Oral)   Resp 20   SpO2 95%  General:  alert, cooperative, appears stated age and no distress  Skin:   A few small pink papules noted on lower legs and superior aspect of feet bilaterally, measuring approximately half a centimeter.  Excoriation noted.  Th majority of the lesions appear to be healing with slight hyperpigmentation present.  No surrounding erythema, edema, ecchymosis, petechia/purpura, or warmth to the touch noted.  No tenderness noted.  No  drainage or bleeding noted.     Assessment:    bites, insect    Plan:    Verbal patient instruction given.   Discussed OTC treatments.  Prescribed an antihistamine to use as needed for itching, as well as a topical steroid.  Discussed side/adverse effects and use of these. Discussed avoiding scratching and monitoring for signs of secondary infection and reporting these. Patient's blood pressure is 148/92 today.  Discussed normal values.  Advised patient to monitor this daily and report abnormal values to her primary care provider. Red flag symptoms and indications to seek medical care discussed.

## 2017-11-07 ENCOUNTER — Other Ambulatory Visit: Payer: Self-pay | Admitting: Family Medicine

## 2017-11-07 DIAGNOSIS — Z1231 Encounter for screening mammogram for malignant neoplasm of breast: Secondary | ICD-10-CM

## 2017-11-11 ENCOUNTER — Ambulatory Visit
Admission: RE | Admit: 2017-11-11 | Discharge: 2017-11-11 | Disposition: A | Discharge status: Home / Self Care | Payer: Managed Care, Other (non HMO) | Source: Ambulatory Visit | Attending: Family Medicine | Admitting: Family Medicine

## 2017-11-11 DIAGNOSIS — Z1231 Encounter for screening mammogram for malignant neoplasm of breast: Secondary | ICD-10-CM

## 2017-11-28 ENCOUNTER — Ambulatory Visit: Payer: Self-pay | Admitting: Emergency Medicine

## 2017-11-28 VITALS — BP 142/86 | HR 88 | Temp 98.9°F | Resp 16 | Ht 64.0 in | Wt 250.0 lb

## 2017-11-28 DIAGNOSIS — R319 Hematuria, unspecified: Secondary | ICD-10-CM

## 2017-11-28 DIAGNOSIS — R300 Dysuria: Secondary | ICD-10-CM

## 2017-11-28 LAB — POCT URINALYSIS DIPSTICK
BILIRUBIN UA: NEGATIVE
Glucose, UA: NEGATIVE
KETONES UA: NEGATIVE
Leukocytes, UA: NEGATIVE
Nitrite, UA: NEGATIVE
PH UA: 8.5 — AB (ref 5.0–8.0)
Protein, UA: POSITIVE — AB
Spec Grav, UA: 1.015 (ref 1.010–1.025)
UROBILINOGEN UA: 0.2 U/dL

## 2017-11-28 MED ORDER — CYCLOBENZAPRINE HCL 5 MG PO TABS: ORAL_TABLET | ORAL | 1 refills | Status: DC

## 2017-11-28 NOTE — Patient Instructions (Signed)
We will call you with your urine culture results. Please make an appointment with your primary care provider for follow-up of hematuria. Use ice to alternate with heat for your back pain. Take Tylenol for your back discomfort. I have sent in a prescription for a muscle relaxant to take at night. Hematuria, Adult Hematuria is blood in your urine. It can be caused by a bladder infection, kidney infection, prostate infection, kidney stone, or cancer of your urinary tract. Infections can usually be treated with medicine, and a kidney stone usually will pass through your urine. If neither of these is the cause of your hematuria, further workup to find out the reason may be needed. It is very important that you tell your health care provider about any blood you see in your urine, even if the blood stops without treatment or happens without causing pain. Blood in your urine that happens and then stops and then happens again can be a symptom of a very serious condition. Also, pain is not a symptom in the initial stages of many urinary cancers. Follow these instructions at home:  Drink lots of fluid, 3-4 quarts a day. If you have been diagnosed with an infection, cranberry juice is especially recommended, in addition to large amounts of water.  Avoid caffeine, tea, and carbonated beverages because they tend to irritate the bladder.  Avoid alcohol because it may irritate the prostate.  Take all medicines as directed by your health care provider.  If you were prescribed an antibiotic medicine, finish it all even if you start to feel better.  If you have been diagnosed with a kidney stone, follow your health care provider's instructions regarding straining your urine to catch the stone.  Empty your bladder often. Avoid holding urine for long periods of time.  After a bowel movement, women should cleanse front to back. Use each tissue only once.  Empty your bladder before and after sexual intercourse if  you are a female. Contact a health care provider if:  You develop back pain.  You have a fever.  You have a feeling of sickness in your stomach (nausea) or vomiting.  Your symptoms are not better in 3 days. Return sooner if you are getting worse. Get help right away if:  You develop severe vomiting and are unable to keep the medicine down.  You develop severe back or abdominal pain despite taking your medicines.  You begin passing a large amount of blood or clots in your urine.  You feel extremely weak or faint, or you pass out. This information is not intended to replace advice given to you by your health care provider. Make sure you discuss any questions you have with your health care provider. Document Released: 02/05/2005 Document Revised: 07/14/2015 Document Reviewed: 10/06/2012 Elsevier Interactive Patient Education  2017 [employer].

## 2017-11-28 NOTE — Progress Notes (Signed)
Subjective. Patient presents with urinary frequency without burning.  She was seen 11/17/2017 by her family doctor.  She was found to have a urinary tract infection secondary to E. coli.  She was treated with cephalexin 500 mg.  She took this for 1 week.  She also was treated with Flagyl for BV coverage.  GC chlamydia testing was negative urine culture grew E. coli sensitive to cephalexin.  She enters today with pain left lower back with pain into her left leg as well as urinary frequency nocturia times 2 .  Of note patient is status post surgery on her lumbar spine. Objective. Alert and cooperative in no distress. There is pain in the left lower back with twisting bending and forward flexion. Motor strength left leg normal. Abdomen without tenderness or masses. Urinalysis showed 1+ blood negative sugar negative nitrite. Assessment. Her pain seems to be musculoskeletal.  Her hematuria may be a residual from her urinary tract infection. Plan. Tylenol during the day for pain. Flexeril 5 mg at night. Urine culture sent. Follow-up hematuria with her primary care provider.

## 2017-11-30 ENCOUNTER — Telehealth: Payer: Self-pay | Admitting: Emergency Medicine

## 2017-11-30 LAB — URINE CULTURE

## 2017-11-30 NOTE — Telephone Encounter (Signed)
Urine culture greater than 100,000 gram-negative rods I called the patient. She continues to have back discomfort .I called [facility] [phone] and she will be on cephalexin 500 3 times a day for 1 week. I advised her to follow-up with her doctor on Monday to have further evaluation of her back pain and recurrent urinary tract infections. Patient agrees.

## 2018-01-20 ENCOUNTER — Ambulatory Visit: Payer: Self-pay

## 2018-01-20 DIAGNOSIS — Z299 Encounter for prophylactic measures, unspecified: Secondary | ICD-10-CM

## 2018-01-29 DIAGNOSIS — F418 Other specified anxiety disorders: Secondary | ICD-10-CM | POA: Insufficient documentation

## 2018-04-14 ENCOUNTER — Ambulatory Visit: Payer: Self-pay | Admitting: Adult Health

## 2018-04-14 VITALS — BP 138/76 | HR 67 | Temp 98.0°F | Resp 16 | Ht 64.0 in | Wt 259.0 lb

## 2018-04-14 DIAGNOSIS — R399 Unspecified symptoms and signs involving the genitourinary system: Secondary | ICD-10-CM

## 2018-04-14 DIAGNOSIS — E785 Hyperlipidemia, unspecified: Secondary | ICD-10-CM | POA: Insufficient documentation

## 2018-04-14 LAB — POCT URINALYSIS DIPSTICK
BILIRUBIN UA: NEGATIVE
Blood, UA: NEGATIVE
Glucose, UA: NEGATIVE
KETONES UA: NEGATIVE
Leukocytes, UA: NEGATIVE
Nitrite, UA: NEGATIVE
PROTEIN UA: POSITIVE — AB
Spec Grav, UA: 1.025 (ref 1.010–1.025)
Urobilinogen, UA: 0.2 E.U./dL
pH, UA: 5.5 (ref 5.0–8.0)

## 2018-04-14 MED ORDER — AMOXICILLIN-POT CLAVULANATE 500-125 MG PO TABS: 1.0000 | ORAL_TABLET | Freq: Two times a day (BID) | ORAL | 0 refills | Status: DC

## 2018-04-14 NOTE — Patient Instructions (Signed)
Urinary Frequency, Adult Urinary frequency means urinating more often than usual. You may urinate every 1-2 hours even though you drink a normal amount of fluid and do not have a bladder infection or condition. Although you urinate more often than normal, the total amount of urine produced in a day is normal. With urinary frequency, you may have an urgent need to urinate often. The stress and anxiety of needing to find a bathroom quickly can make this urge worse. This condition may go away on its own or you may need treatment at home. Home treatment may include bladder training, exercises, taking medicines, or making changes to your diet. Follow these instructions at home: Bladder health   Keep a bladder diary if told by your health care provider. Keep track of: ? What you eat and drink. ? How often you urinate. ? How much you urinate.  Follow a bladder training program if told by your health care provider. This may include: ? Learning to delay going to the bathroom. ? Double urinating (voiding). This helps if you are not completely emptying your bladder. ? Scheduled voiding.  Do Kegel exercises as told by your health care provider. Kegel exercises strengthen the muscles that help control urination, which may help the condition. Eating and drinking  If told by your health care provider, make diet changes, such as: ? Avoiding caffeine. ? Drinking fewer fluids, especially alcohol. ? Not drinking in the evening. ? Avoiding foods or drinks that may irritate the bladder. These include coffee, tea, soda, artificial sweeteners, citrus, tomato-based foods, and chocolate. ? Eating foods that help prevent or ease constipation. Constipation can make this condition worse. Your health care provider may recommend that you:  Drink enough fluid to keep your urine pale yellow.  Take over-the-counter or prescription medicines.  Eat foods that are high in fiber, such as beans, whole grains, and fresh  fruits and vegetables.  Limit foods that are high in fat and processed sugars, such as fried or sweet foods. General instructions  Take over-the-counter and prescription medicines only as told by your health care provider.  Keep all follow-up visits as told by your health care provider. This is important. Contact a health care provider if:  You start urinating more often.  You feel pain or irritation when you urinate.  You notice blood in your urine.  Your urine looks cloudy.  You develop a fever.  You begin vomiting. Get help right away if:  You are unable to urinate. Summary  Urinary frequency means urinating more often than usual. With urinary frequency, you may urinate every 1-2 hours even though you drink a normal amount of fluid and do not have a bladder infection or other bladder condition.  Your health care provider may recommend that you keep a bladder diary, follow a bladder training program, or make dietary changes.  If told by your health care provider, do Kegel exercises to strengthen the muscles that help control urination.  Take over-the-counter and prescription medicines only as told by your health care provider.  Contact a health care provider if your symptoms do not improve or get worse. This information is not intended to replace advice given to you by your health care provider. Make sure you discuss any questions you have with your health care provider. Document Released: 12/02/2008 Document Revised: 08/15/2017 Document Reviewed: 08/15/2017 Elsevier Interactive Patient Education  2019 Elsevier Inc. Urinary Tract Infection, Adult A urinary tract infection (UTI) is an infection of any part of the  urinary tract. The urinary tract includes:  The kidneys.  The ureters.  The bladder.  The urethra. These organs make, store, and get rid of pee (urine) in the body. What are the causes? This is caused by germs (bacteria) in your genital area. These germs  grow and cause swelling (inflammation) of your urinary tract. What increases the risk? You are more likely to develop this condition if:  You have a small, thin tube (catheter) to drain pee.  You cannot control when you pee or poop (incontinence).  You are female, and: ? You use these methods to prevent pregnancy: ? A medicine that kills sperm (spermicide). ? A device that blocks sperm (diaphragm). ? You have low levels of a female hormone (estrogen). ? You are pregnant.  You have genes that add to your risk.  You are sexually active.  You take antibiotic medicines.  You have trouble peeing because of: ? A prostate that is bigger than normal, if you are female. ? A blockage in the part of your body that drains pee from the bladder (urethra). ? A kidney stone. ? A nerve condition that affects your bladder (neurogenic bladder). ? Not getting enough to drink. ? Not peeing often enough.  You have other conditions, such as: ? Diabetes. ? A weak disease-fighting system (immune system). ? Sickle cell disease. ? Gout. ? Injury of the spine. What are the signs or symptoms? Symptoms of this condition include:  Needing to pee right away (urgently).  Peeing often.  Peeing small amounts often.  Pain or burning when peeing.  Blood in the pee.  Pee that smells bad or not like normal.  Trouble peeing.  Pee that is cloudy.  Fluid coming from the vagina, if you are female.  Pain in the belly or lower back. Other symptoms include:  Throwing up (vomiting).  No urge to eat.  Feeling mixed up (confused).  Being tired and grouchy (irritable).  A fever.  Watery poop (diarrhea). How is this treated? This condition may be treated with:  Antibiotic medicine.  Other medicines.  Drinking enough water. Follow these instructions at home:  Medicines  Take over-the-counter and prescription medicines only as told by your doctor.  If you were prescribed an antibiotic  medicine, take it as told by your doctor. Do not stop taking it even if you start to feel better. General instructions  Make sure you: ? Pee until your bladder is empty. ? Do not hold pee for a long time. ? Empty your bladder after sex. ? Wipe from front to back after pooping if you are a female. Use each tissue one time when you wipe.  Drink enough fluid to keep your pee pale yellow.  Keep all follow-up visits as told by your doctor. This is important. Contact a doctor if:  You do not get better after 1-2 days.  Your symptoms go away and then come back. Get help right away if:  You have very bad back pain.  You have very bad pain in your lower belly.  You have a fever.  You are sick to your stomach (nauseous).  You are throwing up. Summary  A urinary tract infection (UTI) is an infection of any part of the urinary tract.  This condition is caused by germs in your genital area.  There are many risk factors for a UTI. These include having a small, thin tube to drain pee and not being able to control when you pee or poop.  Treatment includes antibiotic medicines for germs.  Drink enough fluid to keep your pee pale yellow. This information is not intended to replace advice given to you by your health care provider. Make sure you discuss any questions you have with your health care provider. Document Released: 07/25/2007 Document Revised: 08/15/2017 Document Reviewed: 08/15/2017 Elsevier Interactive Patient Education  2019 Elsevier Inc. Amoxicillin; Clavulanic Acid tablets What is this medicine? AMOXICILLIN; CLAVULANIC ACID (a mox i SIL in; KLAV yoo lan ic AS id) is a penicillin antibiotic. It is used to treat certain kinds of bacterial infections. It will not work for colds, flu, or other viral infections. This medicine may be used for other purposes; ask your health care provider or pharmacist if you have questions. COMMON BRAND NAME(S): Augmentin What should I tell my  health care provider before I take this medicine? They need to know if you have any of these conditions: -bowel disease, like colitis -kidney disease -liver disease -mononucleosis -an unusual or allergic reaction to amoxicillin, penicillin, cephalosporin, other antibiotics, clavulanic acid, other medicines, foods, dyes, or preservatives -pregnant or trying to get pregnant -breast-feeding How should I use this medicine? Take this medicine by mouth with a full glass of water. Follow the directions on the prescription label. Take at the start of a meal. Do not crush or chew. If the tablet has a score line, you may cut it in half at the score line for easier swallowing. Take your medicine at regular intervals. Do not take your medicine more often than directed. Take all of your medicine as directed even if you think you are better. Do not skip doses or stop your medicine early. Talk to your pediatrician regarding the use of this medicine in children. Special care may be needed. Overdosage: If you think you have taken too much of this medicine contact a poison control center or emergency room at once. NOTE: This medicine is only for you. Do not share this medicine with others. What if I miss a dose? If you miss a dose, take it as soon as you can. If it is almost time for your next dose, take only that dose. Do not take double or extra doses. What may interact with this medicine? -allopurinol -anticoagulants -birth control pills -methotrexate -probenecid This list may not describe all possible interactions. Give your health care provider a list of all the medicines, herbs, non-prescription drugs, or dietary supplements you use. Also tell them if you smoke, drink alcohol, or use illegal drugs. Some items may interact with your medicine. What should I watch for while using this medicine? Tell your doctor or health care professional if your symptoms do not improve. Do not treat diarrhea with over the  counter products. Contact your doctor if you have diarrhea that lasts more than 2 days or if it is severe and watery. If you have diabetes, you may get a false-positive result for sugar in your urine. Check with your doctor or health care professional. Birth control pills may not work properly while you are taking this medicine. Talk to your doctor about using an extra method of birth control. What side effects may I notice from receiving this medicine? Side effects that you should report to your doctor or health care professional as soon as possible: -allergic reactions like skin rash, itching or hives, swelling of the face, lips, or tongue -breathing problems -dark urine -fever or chills, sore throat -redness, blistering, peeling or loosening of the skin, including inside the mouth -seizures -  trouble passing urine or change in the amount of urine -unusual bleeding, bruising -unusually weak or tired -white patches or sores in the mouth or throat Side effects that usually do not require medical attention (report to your doctor or health care professional if they continue or are bothersome): -diarrhea -dizziness -headache -nausea, vomiting -stomach upset -vaginal or anal irritation This list may not describe all possible side effects. Call your doctor for medical advice about side effects. You may report side effects to FDA at 1-800-FDA-1088. Where should I keep my medicine? Keep out of the reach of children. Store at room temperature below 25 degrees C (77 degrees F). Keep container tightly closed. Throw away any unused medicine after the expiration date. NOTE: This sheet is a summary. It may not cover all possible information. If you have questions about this medicine, talk to your doctor, pharmacist, or health care provider.  2019 Elsevier/Gold Standard (2007-05-01 12:04:30)

## 2018-04-14 NOTE — Progress Notes (Signed)
[facility]  Subjective:     Patient ID: Lindsey Caldwell, female   DOB: Jan 08, 1956, 63 y.o.   MRN: [id number]   Blood pressure 138/76, pulse 67, temperature 98 F (36.7 C), temperature source Oral, resp. rate 16, height 5\' 4"  (1.626 m), weight 259 lb (117.5 kg), SpO2 97 %.  Patient is a 63 year old female in no acute distress who comes to the clinic with complaints of left lower back pain x 1 day with urinary symptoms she reports she had this  two weeks ago and this went away without any treatment. Started again with urinary frequency with mild burning yesterday.  Denies any blood in urine.    Patient reports the symptoms are very similar to her previous urinary tract infection.  Blood glucose 120 this morning fasting.  Creatinine 1.1 / BUN 13 and  11/18/2017 GFR >60    Urinary Tract Infection   This is a recurrent problem. The current episode started yesterday. The problem occurs intermittently. The problem has been gradually worsening. The quality of the pain is described as burning. The patient is experiencing no pain. There has been no fever. She is sexually active (no new partners ). There is no history of pyelonephritis. Associated symptoms include frequency. Pertinent negatives include no chills, discharge, flank pain, hematuria, hesitancy, possible pregnancy, sweats, urgency or vomiting. She has tried antibiotics for the symptoms.    History :  She was seen 11/17/2017 by her family doctor.  She was found to have a urinary tract infection secondary to E. coli.  She was treated with cephalexin 500 mg.  She took this for 1 week.  She also was treated with Flagyl for BV coverage.  GC chlamydia testing was negative urine culture grew E. coli sensitive to cephalexin.   She was also seen by Dr. Everlene Farrier on 10/102019  with pain left lower back with pain into her left leg as well as urinary frequency nocturia times 2 . Found to have hematuria without infection.  Of note patient is status post surgery on her lumbar spine. She was treated for musculoskeletal pain. She had hematuria on urine dipstick and was to follow up with her primary care. 12/10/2017 she was treated with Keflex for UTI by her primary care provider. Allergies  Allergen Reactions  . Gadolinium Derivatives Hives    Hives on chest/redness/itching after 19 cc multihance-benadryl was given per radiologist Hives on chest/redness/itching after 19 cc multihance-benadryl was given per radiologist  . Codeine Nausea And Vomiting  . Iodine Hives  . Percocet [Oxycodone-Acetaminophen] Nausea And Vomiting   Patient Active Problem List   Diagnosis Date Noted  . HLD (hyperlipidemia) 04/14/2018  . Generalized osteoarthritis of hand 07/04/2017  . Elevated rheumatoid factor 06/14/2017  . Pain of right hand 06/14/2017  . Anxiety state 02/01/2016  . Paresthesia 08/22/2015  . Fibroid, uterine 06/21/2015  . Cervical stenosis (uterine cervix) 06/21/2015  . Atrophic vaginitis 01/31/2015  . Edema 07/08/2014  . Vitamin D deficiency 06/03/2014  . Routine general medical examination at a health care facility 01/25/2014  . Snoring 01/25/2014  . Chronic kidney disease (CKD), stage III (moderate) ([place]) 09/23/2013  . Diabetes type 2, controlled ([place]) 08/17/2013  . Hypertension 08/17/2013  . Gastroesophageal reflux disease without esophagitis 08/17/2013  . Morbid obesity ([place]) 08/17/2013  . Left-sided low back pain with sciatica 08/17/2013     Review of Systems  Constitutional: Negative for activity change, appetite change, chills, diaphoresis, fatigue, fever and unexpected weight change.  HENT: Negative.   Eyes: Negative.   Respiratory: Negative.   Cardiovascular: Negative.   Gastrointestinal: Negative for abdominal distention, abdominal pain, anal bleeding, blood in stool, constipation, diarrhea, rectal pain and vomiting.  Genitourinary: Positive for dysuria and frequency. Negative for decreased  urine volume, difficulty urinating, dyspareunia, enuresis, flank pain, genital sores, hematuria, hesitancy, menstrual problem, pelvic pain, urgency, vaginal bleeding, vaginal discharge and vaginal pain.  Musculoskeletal: Positive for back pain (left lower x 1 day ). Negative for arthralgias, gait problem, joint swelling, myalgias, neck pain and neck stiffness.  Skin: Negative.   Allergic/Immunologic:        -- Gadolinium Derivatives -- Hives   --  Hives on chest/redness/itching after 19 cc            multihance-benadryl was given per radiologistHives            on chest/redness/itching after 19 cc            multihance-benadryl was given per radiologist  -- Codeine -- Nausea And Vomiting  -- Iodine -- Hives  -- Percocet (Oxycodone-Acetaminophen) -- Nausea And Vomiting   Neurological: Negative.   Hematological: Negative.   Psychiatric/Behavioral: Negative for agitation, behavioral problems, confusion, decreased concentration, dysphoric mood, hallucinations, self-injury, sleep disturbance and suicidal ideas. The patient is not nervous/anxious and is not hyperactive.        Objective:   Physical Exam Vitals signs reviewed.  Constitutional:      General: She is awake. She is not in acute distress.    Appearance: Normal appearance. She is well-developed and well-groomed. She is obese. She is not ill-appearing, toxic-appearing or diaphoretic.     Comments: Patient is alert and oriented and responsive to questions Engages in eye contact with provider. Speaks in full sentences without any pauses without any shortness of breath or distress.    HENT:     Head: Normocephalic and atraumatic.     Mouth/Throat:     Mouth: Mucous membranes are moist.     Pharynx: No oropharyngeal exudate or posterior oropharyngeal erythema.  Eyes:     General: No scleral icterus.       Right eye: No discharge.        Left eye: No discharge.     Conjunctiva/sclera: Conjunctivae normal.     Pupils: Pupils are  equal, round, and reactive to light.  Neck:     Musculoskeletal: Normal range of motion. No neck rigidity or muscular tenderness.  Cardiovascular:     Rate and Rhythm: Normal rate and regular rhythm.     Pulses: Normal pulses.     Heart sounds: Normal heart sounds. No murmur. No friction rub. No gallop.   Pulmonary:     Effort: Pulmonary effort is normal. No respiratory distress.     Breath sounds: Normal breath sounds. No stridor. No wheezing, rhonchi or rales.  Chest:     Chest wall: No tenderness.  Abdominal:     General: Bowel sounds are normal. There is no distension.     Palpations: Abdomen is soft. There is no mass.     Tenderness: There is no abdominal tenderness. There is no right CVA tenderness, left CVA tenderness, guarding or rebound.     Hernia: No hernia is present.  Musculoskeletal: Normal range of motion.        General: No swelling, tenderness, deformity or signs of injury.     Lumbar back: Normal. She exhibits normal range of motion, no tenderness, no bony tenderness, no swelling, no  edema, no deformity, no laceration, no pain, no spasm and normal pulse.     Right lower leg: No edema.     Left lower leg: No edema.     Comments: Patient moves on and off of exam table and in room without difficulty. Gait is normal in hall and in room. Patient is oriented to person place time and situation. Patient answers questions appropriately and engages in conversation.   Lymphadenopathy:     Cervical: No cervical adenopathy.  Skin:    General: Skin is warm.     Capillary Refill: Capillary refill takes less than 2 seconds.     Coloration: Skin is not jaundiced or pale.     Findings: No bruising, erythema, lesion or rash.  Neurological:     General: No focal deficit present.     Mental Status: She is alert and oriented to person, place, and time.     Comments: Patient moves on and off of exam table and in room without difficulty. Gait is normal in hall and in room. Patient is  oriented to person place time and situation. Patient answers questions appropriately and engages in conversation.   Psychiatric:        Mood and Affect: Mood normal.        Behavior: Behavior is cooperative.        Thought Content: Thought content normal.        Judgment: Judgment normal.        Assessment:     Urinary tract infection symptoms - Plan: POCT Urinalysis Dipstick (CPT 81002), Urine Culture  Results for orders placed or performed in visit on 04/14/18 (from the past 24 hour(s))  POCT Urinalysis Dipstick (CPT 81002)     Status: Abnormal   Collection Time: 04/14/18 12:15 PM  Result Value Ref Range   Color, UA yellow    Clarity, UA clear    Glucose, UA Negative Negative   Bilirubin, UA Negative    Ketones, UA Negative    Spec Grav, UA 1.025 1.010 - 1.025   Blood, UA Negative    pH, UA 5.5 5.0 - 8.0   Protein, UA Positive (A) Negative   Urobilinogen, UA 0.2 0.2 or 1.0 E.U./dL   Nitrite, UA Negative    Leukocytes, UA Negative Negative   Appearance     Odor        Plan:     Airelle was seen today for dysuria.  Diagnoses and all orders for this visit:  Urinary tract infection symptoms -     POCT Urinalysis Dipstick (CPT 81002) -     Urine Culture  Other orders -     amoxicillin-clavulanate (AUGMENTIN) 500-125 MG tablet; Take 1 tablet (500 mg total) by mouth 2 (two) times daily.  Will start antibiotic as above, patient has had urine dipstick negative in the past with growth of gram-negative rods at last visit.  We will have her stop antibiotic if urine culture returns negative.  She is to schedule a follow-up with her primary care for possible referral to urology as recommended by this provider given her repeat urinary symptoms. Discussed hydration. Discussed red flag symptoms with patient and she will if symptoms occur or any symptoms worsen from this office visit.  Advised patient call the office or your primary care doctor for an appointment if no  improvement within 72 hours or if any symptoms change or worsen at any time  Advised ER or urgent Care if after hours or on weekend.  Call 911 for emergency symptoms at any time.Patinet verbalized understanding of all instructions given/reviewed and treatment plan and has no further questions or concerns at this time.

## 2018-04-16 LAB — URINE CULTURE

## 2018-04-17 ENCOUNTER — Telehealth: Payer: Self-pay | Admitting: Adult Health

## 2018-04-17 MED ORDER — AMOXICILLIN-POT CLAVULANATE 500-125 MG PO TABS: 1.0000 | ORAL_TABLET | Freq: Two times a day (BID) | ORAL | 0 refills | Status: DC

## 2018-04-17 NOTE — Telephone Encounter (Signed)
04/17/18   patient return call to the office and was given instructions as below and urine culture results.    She has had a history of urinary tract infections and is instructed to follow-up with her primary care provider for repeat urine after finishing antibiotic therapy.  Patient with 3 days of medication was called in below to equal a 10-day course of antibiotics. Will then follow-up with her primary care provider.Patient verbalized understanding of all instructions given and denies any further questions at this time.   Meds ordered this encounter  Medications  . amoxicillin-clavulanate (AUGMENTIN) 500-125 MG tablet    Sig: Take 1 tablet (500 mg total) by mouth 2 (two) times daily. This is additional to the 7 days patient already has.    Dispense:  6 tablet    Refill:  0    This is an additional 3 day course to be added to the 7 day course the patient is already on.     04/17/18 9:14am left message for patient to return call to clinic. Urine results below.  She is on correct antibiotic.  She should continue taking the antibiotic and follow-up as needed if symptoms do not completely clear or if any symptom worsens.      Contains abnormal data Urine Culture  Order: [id number]  Status:  Final result  Visible to patient:  No (Not Released)  Next appt:  None  Dx:  Urinary tract infection symptoms  Specimen Information: Urine     Component 3d ago  Urine Culture, Routine Final reportAbnormal    Organism ID, Bacteria Klebsiella pneumoniaeAbnormal    Comment: 50,000-100,000 colony forming units per mL  Cefazolin <=4 ug/mL  Cefazolin with an MIC <=16 predicts susceptibility to the oral agents  cefaclor, cefdinir, cefpodoxime, cefprozil, cefuroxime, cephalexin,  and loracarbef when used for therapy of uncomplicated urinary tract  infections due to E. coli, Klebsiella pneumoniae, and Proteus  mirabilis.   Antimicrobial Susceptibility Comment   Comment:   ** S =  Susceptible; I = Intermediate; R = Resistant **           P = Positive; N = Negative        MICS are expressed in micrograms per mL   Antibiotic         RSLT#1  RSLT#2  RSLT#3  RSLT#4  Amoxicillin/Clavulanic Acid  S  Ampicillin           R  Cefepime            S  Ceftriaxone          S  Cefuroxime           S  Ciprofloxacin         S  Ertapenem           S  Gentamicin           S  Imipenem            S  Levofloxacin          S  Meropenem           S  Nitrofurantoin         I  Piperacillin/Tazobactam    S  Tetracycline          S  Tobramycin           S  Trimethoprim/Sulfa       S   Resulting Agency [facility]

## 2018-04-17 NOTE — Telephone Encounter (Signed)
04/17/18 9:14am left message for patient to return call to clinic. Urine results below.  She is on correct antibiotic.  She should continue taking the antibiotic and follow-up as needed if symptoms do not completely clear or if any symptom worsens.      Contains abnormal data Urine Culture  Order: [id number]  Status:  Final result  Visible to patient:  No (Not Released)  Next appt:  None  Dx:  Urinary tract infection symptoms  Specimen Information: Urine     Component 3d ago  Urine Culture, Routine Final reportAbnormal    Organism ID, Bacteria Klebsiella pneumoniaeAbnormal    Comment: 50,000-100,000 colony forming units per mL  Cefazolin <=4 ug/mL  Cefazolin with an MIC <=16 predicts susceptibility to the oral agents  cefaclor, cefdinir, cefpodoxime, cefprozil, cefuroxime, cephalexin,  and loracarbef when used for therapy of uncomplicated urinary tract  infections due to E. coli, Klebsiella pneumoniae, and Proteus  mirabilis.   Antimicrobial Susceptibility Comment   Comment:   ** S = Susceptible; I = Intermediate; R = Resistant **           P = Positive; N = Negative        MICS are expressed in micrograms per mL   Antibiotic         RSLT#1  RSLT#2  RSLT#3  RSLT#4  Amoxicillin/Clavulanic Acid  S  Ampicillin           R  Cefepime            S  Ceftriaxone          S  Cefuroxime           S  Ciprofloxacin         S  Ertapenem           S  Gentamicin           S  Imipenem            S  Levofloxacin          S  Meropenem           S  Nitrofurantoin         I  Piperacillin/Tazobactam    S  Tetracycline          S  Tobramycin           S  Trimethoprim/Sulfa       S   Resulting Agency [facility]

## 2018-04-17 NOTE — Progress Notes (Signed)
04/17/18 9:14am left message for patient to return call to clinic. Urine results below.  She is on correct antibiotic.  She should continue taking the antibiotic and follow-up as needed if symptoms do not completely clear or if any symptom worsens.

## 2018-04-28 ENCOUNTER — Other Ambulatory Visit: Payer: Self-pay

## 2018-04-28 ENCOUNTER — Encounter: Payer: Self-pay | Admitting: Adult Health

## 2018-04-28 ENCOUNTER — Ambulatory Visit: Payer: Managed Care, Other (non HMO) | Admitting: Adult Health

## 2018-04-28 VITALS — BP 140/78 | HR 81 | Temp 98.3°F | Resp 16 | Ht 64.0 in | Wt 254.1 lb

## 2018-04-28 DIAGNOSIS — M545 Low back pain, unspecified: Secondary | ICD-10-CM

## 2018-04-28 DIAGNOSIS — N898 Other specified noninflammatory disorders of vagina: Secondary | ICD-10-CM

## 2018-04-28 DIAGNOSIS — R350 Frequency of micturition: Secondary | ICD-10-CM

## 2018-04-28 LAB — POCT URINALYSIS DIPSTICK
BILIRUBIN UA: NEGATIVE
Blood, UA: NEGATIVE
Glucose, UA: NEGATIVE
Ketones, UA: NEGATIVE
Leukocytes, UA: NEGATIVE
Nitrite, UA: NEGATIVE
PROTEIN UA: NEGATIVE
Spec Grav, UA: <=1.005 — AB (ref 1.010–1.025)
Urobilinogen, UA: 0.2 E.U./dL
pH, UA: 5.5 (ref 5.0–8.0)

## 2018-04-28 LAB — GLUCOSE, POCT (MANUAL RESULT ENTRY): POC Glucose: 81 mg/dl (ref 70–99)

## 2018-04-28 NOTE — Progress Notes (Signed)
[facility] Subjective:     Patient ID: Lindsey Caldwell, female   DOB: 07/12/1955, 63 y.o.   MRN: [id number]  HPI   Blood pressure 140/78, pulse 81, temperature 98.3 F (36.8 C), temperature source Oral, resp. rate 16, height 5\' 4"  (1.626 m), weight 254 lb 1.6 oz (115.3 kg), SpO2 96 %. Glucose 81.  Patient is a 63 year old female in no acute distress who comes with chief complaint of urinary frequency, she has pelvic pressure and urinary frequency.She has mild brown vaginal discharge x 3 days.  04/14/18 she was treated 10 days of Augmentin for Urinary tract infection of Klebsiella Pneumonia. She reports she has no dysuria at this time. She continues to have urinary frequency, pelvic pressure and now brown vaginal discharge for 3 days( this is new)  She has glucose of 81 in office today- she is doing [place] system diet she reports for three months.   She has had history of left sided lower back pain and frequency, dysuria intermittent since December.,  She denies any urinary incontinence.  She denies any dysuria.  She denies vaginal itching or lesions.   She reports no pelvic exam in two years.   She reports today that she did do pelvic floor training for urinary frequency /incontinece in 2010.   She has had tubal ligation.  She as her ovaries and uterus.   Patient  denies any fever, body aches,chills, rash, chest pain, shortness of breath, nausea, vomiting, or diarrhea.   Patient Active Problem List   Diagnosis Date Noted  . HLD (hyperlipidemia) 04/14/2018  . Situational anxiety 01/29/2018  . Generalized osteoarthritis of hand 07/04/2017  . Elevated rheumatoid factor 06/14/2017  . Pain of right hand 06/14/2017  . Anxiety state 02/01/2016  . Paresthesia 08/22/2015  . Fibroid, uterine 06/21/2015  . Cervical stenosis (uterine cervix) 06/21/2015  . Atrophic vaginitis 01/31/2015  . Edema 07/08/2014  . Vitamin D deficiency 06/03/2014  .  Routine general medical examination at a health care facility 01/25/2014  . Snoring 01/25/2014  . Chronic kidney disease (CKD), stage III (moderate) ([place]) 09/23/2013  . Diabetes type 2, controlled ([place]) 08/17/2013  . Hypertension 08/17/2013  . Gastroesophageal reflux disease without esophagitis 08/17/2013  . Morbid obesity ([place]) 08/17/2013  . Left-sided low back pain with sciatica 08/17/2013     Current Outpatient Medications:  .  allopurinol (ZYLOPRIM) 300 MG tablet, Take 1 tablet (300 mg total) by mouth daily., Disp: 30 tablet, Rfl: 6 .  amLODipine (NORVASC) 5 MG tablet, Take 2 tablets (10 mg total) daily by mouth., Disp: 180 tablet, Rfl: 3 .  cyclobenzaprine (FLEXERIL) 5 MG tablet, Take 1 tablet at night as a muscle relaxant., Disp: 30 tablet, Rfl: 1 .  Dexlansoprazole 30 MG capsule, Take by mouth., Disp: , Rfl:  .  fluticasone (FLONASE) 50 MCG/ACT nasal spray, Place 2 sprays into both nostrils daily., Disp: 16 g, Rfl: 1 .  irbesartan (AVAPRO) 150 MG tablet, Take 150 mg by mouth daily., Disp: , Rfl: 9 .  lovastatin (MEVACOR) 40 MG tablet, Take 0.5 tablets (20 mg total) by mouth at bedtime., Disp: 90 tablet, Rfl: 3 .  montelukast (SINGULAIR) 10 MG tablet, Take 1 tablet (10 mg total) by mouth at bedtime., Disp: 90 tablet, Rfl: 2  Review of Systems  Constitutional: Negative.   HENT: Negative.   Eyes: Negative.   Respiratory: Negative.   Cardiovascular: Negative.   Gastrointestinal: Negative.   Endocrine: Negative for polydipsia and polyphagia.  Genitourinary: Positive for frequency and vaginal discharge. Negative for decreased urine volume, difficulty urinating, dyspareunia, dysuria, enuresis, flank pain, genital sores, hematuria, menstrual problem, pelvic pain, urgency, vaginal bleeding and vaginal pain.  Musculoskeletal: Positive for back pain. Negative for arthralgias, gait problem, joint swelling, myalgias, neck pain and neck stiffness.  Skin: Negative.   Allergic/Immunologic:  Negative for environmental allergies, food allergies and immunocompromised state.  Neurological: Negative.   Hematological: Negative.   Psychiatric/Behavioral: Negative.        Objective:   Physical Exam Nursing note reviewed.  Constitutional:      General: She is not in acute distress.    Appearance: Normal appearance. She is obese. She is not ill-appearing, toxic-appearing or diaphoretic.     Comments: Patient is alert and oriented and responsive to questions Engages in eye contact with provider. Speaks in full sentences without any pauses without any shortness of breath or distress.    HENT:     Head: Normocephalic and atraumatic.     Right Ear: Tympanic membrane normal.     Left Ear: Tympanic membrane normal.     Nose: Nose normal.     Mouth/Throat:     Mouth: Mucous membranes are moist.     Pharynx: No oropharyngeal exudate or posterior oropharyngeal erythema.  Eyes:     General: No scleral icterus.       Right eye: No discharge.        Left eye: No discharge.     Extraocular Movements: Extraocular movements intact.     Conjunctiva/sclera: Conjunctivae normal.     Pupils: Pupils are equal, round, and reactive to light.  Neck:     Musculoskeletal: Normal range of motion and neck supple. No neck rigidity or muscular tenderness.     Vascular: No carotid bruit.  Cardiovascular:     Rate and Rhythm: Normal rate and regular rhythm.     Pulses: Normal pulses.     Heart sounds: Normal heart sounds.  Pulmonary:     Effort: Pulmonary effort is normal. No respiratory distress.     Breath sounds: Normal breath sounds. No stridor. No wheezing, rhonchi or rales.  Chest:     Chest wall: No tenderness.  Abdominal:     General: Abdomen is flat. Bowel sounds are normal. There is no distension.     Palpations: Abdomen is soft. There is no mass.     Tenderness: There is abdominal tenderness in the suprapubic area. There is no right CVA tenderness, left CVA tenderness, guarding or  rebound. Negative signs include Murphy's sign, Rovsing's sign, McBurney's sign, psoas sign and obturator sign.     Hernia: No hernia is present.       Comments: Mild pelvic tenderness with palpation   Genitourinary:    Comments: No gynecology exams done in this office currently and no equipment available. Patient is aware she will have to see her primary care for this.   Musculoskeletal: Normal range of motion.        General: No swelling, tenderness, deformity or signs of injury.     Right lower leg: No edema.     Left lower leg: No edema.     Comments: Patient moves on and off of exam table and in room without difficulty. Gait is normal in hall and in room. Patient is oriented to person place time and situation. Patient answers questions appropriately and engages in conversation.   Lymphadenopathy:     Cervical: No cervical adenopathy.  Skin:  General: Skin is warm and dry.     Capillary Refill: Capillary refill takes less than 2 seconds.  Neurological:     General: No focal deficit present.     Mental Status: She is alert and oriented to person, place, and time.     Cranial Nerves: No cranial nerve deficit.     Sensory: No sensory deficit.     Motor: No weakness.     Coordination: Coordination normal.     Gait: Gait normal.     Deep Tendon Reflexes: Reflexes normal.  Psychiatric:        Mood and Affect: Mood normal.        Behavior: Behavior normal.        Thought Content: Thought content normal.        Judgment: Judgment normal.       Patient has no reproducible lower back pain.  Range of motion is normal. Patient moves on and off of exam table and in room without difficulty. Gait is normal in hall and in room. Patient is oriented to person place time and situation. Patient answers questions appropriately and engages in conversation.  Assessment:     Frequent urination - Plan: Urine Culture, POCT Urinalysis Dipstick (CPT 81002), POCT Glucose (CBG)-Manual entry (CPT  [phone])  Vaginal discharge  Acute left-sided low back pain without sciatica      Plan:      920  am with her primary care 04/29/2018 with Staci Acosta MD  at [facility] primary care for pelvic exam and follow up on urinary frequency, brown vaginal discharge and left sided back pain. Repeat history of urinary tract infections since September 2019.  Possible need for referral to Urology from primary care as well. This is her primary care office.   Note sent for primary care given history.  Patient is aware that no pelvic exams are able to be done in this clinic, she is able to go to [facility] walk-in if symptoms worsen or can wait to see her primary care office in the morning at 9:20 AM.    She will see her primary care office tomorrow ( this office called to schedule) this and patient is in agreement to go.   Advised patient call the office or your primary care doctor for an appointment if no improvement within 72 hours or if any symptoms change or worsen at any time  Advised ER or urgent Care if after hours or on weekend. Call 911 for emergency symptoms at any time.Patinet verbalized understanding of all instructions given/reviewed and treatment plan and has no further questions or concerns at this time.    Patient verbalized understanding of all instructions given and denies any further questions at this time.

## 2018-04-28 NOTE — Patient Instructions (Addendum)
920  am with her primary care 04/29/2018 with Staci Acosta MD  at [facility] primary care for pelvic exam and follow up on urinary frequency, brown vaginal discharge and left sided back pain. Repeat history of urinary tract infections since September 2019.  Possible need for referral to Urology from primary care.   Today urine is negative in office. I will culture given.   Results for orders placed or performed in visit on 04/28/18 (from the past 24 hour(s))  POCT Urinalysis Dipstick (CPT 81002)     Status: Abnormal   Collection Time: 04/28/18  3:19 PM  Result Value Ref Range   Color, UA Yellow    Clarity, UA Clear    Glucose, UA Negative Negative   Bilirubin, UA Negative    Ketones, UA Negative    Spec Grav, UA <=1.005 (A) 1.010 - 1.025   Blood, UA Negative    pH, UA 5.5 5.0 - 8.0   Protein, UA Negative Negative   Urobilinogen, UA 0.2 0.2 or 1.0 E.U./dL   Nitrite, UA Negative    Leukocytes, UA Negative Negative   Appearance     Odor    POCT Glucose (CBG)-Manual entry (CPT [phone])     Status: None   Collection Time: 04/28/18  3:19 PM  Result Value Ref Range   POC Glucose 81 70 - 99 mg/dl     Acute Back Pain, Adult Acute back pain is sudden and usually short-lived. It is often caused by an injury to the muscles and tissues in the back. The injury may result from:  A muscle or ligament getting overstretched or torn (strained). Ligaments are tissues that connect bones to each other. Lifting something improperly can cause a back strain.  Wear and tear (degeneration) of the spinal disks. Spinal disks are circular tissue that provides cushioning between the bones of the spine (vertebrae).  Twisting motions, such as while playing sports or doing yard work.  A hit to the back.  Arthritis. You may have a physical exam, lab tests, and imaging tests to find the cause of your pain. Acute back pain usually goes away with rest and home care. Follow these instructions at home: Managing pain,  stiffness, and swelling  Take over-the-counter and prescription medicines only as told by your health care provider.  Your health care provider may recommend applying ice during the first 24-48 hours after your pain starts. To do this: ? Put ice in a plastic bag. ? Place a towel between your skin and the bag. ? Leave the ice on for 20 minutes, 2-3 times a day.  If directed, apply heat to the affected area as often as told by your health care provider. Use the heat source that your health care provider recommends, such as a moist heat pack or a heating pad. ? Place a towel between your skin and the heat source. ? Leave the heat on for 20-30 minutes. ? Remove the heat if your skin turns bright red. This is especially important if you are unable to feel pain, heat, or cold. You have a greater risk of getting burned. Activity   Do not stay in bed. Staying in bed for more than 1-2 days can delay your recovery.  Sit up and stand up straight. Avoid leaning forward when you sit, or hunching over when you stand. ? If you work at a desk, sit close to it so you do not need to lean over. Keep your chin tucked in. Keep your neck drawn  back, and keep your elbows bent at a right angle. Your arms should look like the letter "L." ? Sit high and close to the steering wheel when you drive. Add lower back (lumbar) support to your car seat, if needed.  Take short walks on even surfaces as soon as you are able. Try to increase the length of time you walk each day.  Do not sit, drive, or stand in one place for more than 30 minutes at a time. Sitting or standing for long periods of time can put stress on your back.  Do not drive or use heavy machinery while taking prescription pain medicine.  Use proper lifting techniques. When you bend and lift, use positions that put less stress on your back: ? [place] your knees. ? Keep the load close to your body. ? Avoid twisting.  Exercise regularly as told by your  health care provider. Exercising helps your back heal faster and helps prevent back injuries by keeping muscles strong and flexible.  Work with a physical therapist to make a safe exercise program, as recommended by your health care provider. Do any exercises as told by your physical therapist. Lifestyle  Maintain a healthy weight. Extra weight puts stress on your back and makes it difficult to have good posture.  Avoid activities or situations that make you feel anxious or stressed. Stress and anxiety increase muscle tension and can make back pain worse. Learn ways to manage anxiety and stress, such as through exercise. General instructions  Sleep on a firm mattress in a comfortable position. Try lying on your side with your knees slightly bent. If you lie on your back, put a pillow under your knees.  Follow your treatment plan as told by your health care provider. This may include: ? Cognitive or behavioral therapy. ? Acupuncture or massage therapy. ? Meditation or yoga. Contact a health care provider if:  You have pain that is not relieved with rest or medicine.  You have increasing pain going down into your legs or buttocks.  Your pain does not improve after 2 weeks.  You have pain at night.  You lose weight without trying.  You have a fever or chills. Get help right away if:  You develop new bowel or bladder control problems.  You have unusual weakness or numbness in your arms or legs.  You develop nausea or vomiting.  You develop abdominal pain.  You feel faint. Summary  Acute back pain is sudden and usually short-lived.  Use proper lifting techniques. When you bend and lift, use positions that put less stress on your back.  Take over-the-counter and prescription medicines and apply heat or ice as directed by your health care provider. This information is not intended to replace advice given to you by your health care provider. Make sure you discuss any questions  you have with your health care provider. Document Released: 02/05/2005 Document Revised: 09/12/2017 Document Reviewed: 09/19/2016 Elsevier Interactive Patient Education  2019 [employer] Urinary Frequency, Adult Urinary frequency means urinating more often than usual. You may urinate every 1-2 hours even though you drink a normal amount of fluid and do not have a bladder infection or condition. Although you urinate more often than normal, the total amount of urine produced in a day is normal. With urinary frequency, you may have an urgent need to urinate often. The stress and anxiety of needing to find a bathroom quickly can make this urge worse. This condition may go away on its  own or you may need treatment at home. Home treatment may include bladder training, exercises, taking medicines, or making changes to your diet. Follow these instructions at home: Bladder health   Keep a bladder diary if told by your health care provider. Keep track of: ? What you eat and drink. ? How often you urinate. ? How much you urinate.  Follow a bladder training program if told by your health care provider. This may include: ? Learning to delay going to the bathroom. ? Double urinating (voiding). This helps if you are not completely emptying your bladder. ? Scheduled voiding.  Do Kegel exercises as told by your health care provider. Kegel exercises strengthen the muscles that help control urination, which may help the condition. Eating and drinking  If told by your health care provider, make diet changes, such as: ? Avoiding caffeine. ? Drinking fewer fluids, especially alcohol. ? Not drinking in the evening. ? Avoiding foods or drinks that may irritate the bladder. These include coffee, tea, soda, artificial sweeteners, citrus, tomato-based foods, and chocolate. ? Eating foods that help prevent or ease constipation. Constipation can make this condition worse. Your health care provider may recommend  that you:  Drink enough fluid to keep your urine pale yellow.  Take over-the-counter or prescription medicines.  Eat foods that are high in fiber, such as beans, whole grains, and fresh fruits and vegetables.  Limit foods that are high in fat and processed sugars, such as fried or sweet foods. General instructions  Take over-the-counter and prescription medicines only as told by your health care provider.  Keep all follow-up visits as told by your health care provider. This is important. Contact a health care provider if:  You start urinating more often.  You feel pain or irritation when you urinate.  You notice blood in your urine.  Your urine looks cloudy.  You develop a fever.  You begin vomiting. Get help right away if:  You are unable to urinate. Summary  Urinary frequency means urinating more often than usual. With urinary frequency, you may urinate every 1-2 hours even though you drink a normal amount of fluid and do not have a bladder infection or other bladder condition.  Your health care provider may recommend that you keep a bladder diary, follow a bladder training program, or make dietary changes.  If told by your health care provider, do Kegel exercises to strengthen the muscles that help control urination.  Take over-the-counter and prescription medicines only as told by your health care provider.  Contact a health care provider if your symptoms do not improve or get worse. This information is not intended to replace advice given to you by your health care provider. Make sure you discuss any questions you have with your health care provider. Document Released: 12/02/2008 Document Revised: 08/15/2017 Document Reviewed: 08/15/2017 Elsevier Interactive Patient Education  2019 [employer] Inc. Vaginitis  Vaginitis is irritation and swelling (inflammation) of the vagina. It happens when normal bacteria and yeast in the vagina grow too much. There are many types of  this condition. Treatment will depend on the type you have. Follow these instructions at home: Lifestyle  Keep your vagina area clean and dry. ? Avoid using soap. ? Rinse the area with water.  Do not do the following until your doctor says it is okay: ? Wash and clean out the vagina (douche). ? Use tampons. ? Have sex.  Wipe from front to back after going to the bathroom.  Let air  reach your vagina. ? Wear cotton underwear. ? Do not wear: ? Underwear while you sleep. ? Tight pants. ? Thong underwear. ? Underwear or nylons without a cotton panel. ? Take off any wet clothing, such as bathing suits, as soon as possible.  Use gentle, non-scented products. Do not use things that can irritate the vagina, such as fabric softeners. Avoid the following products if they are scented: ? Feminine sprays. ? Detergents. ? Tampons. ? Feminine hygiene products. ? Soaps or bubble baths.  Practice safe sex and use condoms. General instructions  Take over-the-counter and prescription medicines only as told by your doctor.  If you were prescribed an antibiotic medicine, take or use it as told by your doctor. Do not stop taking or using the antibiotic even if you start to feel better.  Keep all follow-up visits as told by your doctor. This is important. Contact a doctor if:  You have pain in your belly.  You have a fever.  Your symptoms last for more than 2-3 days. Get help right away if:  You have a fever and your symptoms get worse all of a sudden. Summary  Vaginitis is irritation and swelling of the vagina. It can happen when the normal bacteria and yeast in the vagina grow too much. There are many types.  Treatment will depend on the type you have.  Do not douche, use tampons , or have sex until your health care provider approves. When you can return to sex, practice safe sex and use condoms. This information is not intended to replace advice given to you by your health care  provider. Make sure you discuss any questions you have with your health care provider. Document Released: 05/04/2008 Document Revised: 02/28/2016 Document Reviewed: 02/28/2016 Elsevier Interactive Patient Education  2019 [employer].

## 2018-04-30 LAB — URINE CULTURE

## 2018-05-01 ENCOUNTER — Telehealth: Payer: Self-pay

## 2018-05-01 NOTE — Telephone Encounter (Signed)
Called and spoke with the patient and notified her that her urine culture showed 2 oraganisms but the lab was unable to identify them due to they were non-dominant and that she would need to F/U with her PCP for another urine culture. She stated that she is still having side pain and will F/U with her PCP for another culture.

## 2018-11-13 DIAGNOSIS — Z86010 Personal history of colonic polyps: Secondary | ICD-10-CM | POA: Insufficient documentation

## 2018-11-28 ENCOUNTER — Other Ambulatory Visit: Payer: Self-pay | Admitting: Family Medicine

## 2018-11-28 DIAGNOSIS — Z1231 Encounter for screening mammogram for malignant neoplasm of breast: Secondary | ICD-10-CM

## 2018-12-02 ENCOUNTER — Other Ambulatory Visit: Payer: Self-pay | Admitting: Family Medicine

## 2018-12-08 ENCOUNTER — Other Ambulatory Visit: Payer: Self-pay | Admitting: Family Medicine

## 2018-12-08 DIAGNOSIS — R221 Localized swelling, mass and lump, neck: Secondary | ICD-10-CM

## 2018-12-16 ENCOUNTER — Other Ambulatory Visit: Payer: Self-pay

## 2018-12-16 ENCOUNTER — Ambulatory Visit
Admission: RE | Admit: 2018-12-16 | Discharge: 2018-12-16 | Disposition: A | Discharge status: Home / Self Care | Payer: Managed Care, Other (non HMO) | Source: Ambulatory Visit | Attending: Family Medicine | Admitting: Family Medicine

## 2018-12-16 DIAGNOSIS — R221 Localized swelling, mass and lump, neck: Secondary | ICD-10-CM | POA: Diagnosis not present

## 2018-12-29 ENCOUNTER — Ambulatory Visit
Admission: RE | Admit: 2018-12-29 | Discharge: 2018-12-29 | Disposition: A | Discharge status: Home / Self Care | Payer: Managed Care, Other (non HMO) | Source: Ambulatory Visit | Attending: Family Medicine | Admitting: Family Medicine

## 2018-12-29 ENCOUNTER — Other Ambulatory Visit: Payer: Self-pay

## 2018-12-29 DIAGNOSIS — Z1231 Encounter for screening mammogram for malignant neoplasm of breast: Secondary | ICD-10-CM | POA: Diagnosis present

## 2019-02-19 ENCOUNTER — Other Ambulatory Visit: Payer: Managed Care, Other (non HMO)

## 2019-02-23 ENCOUNTER — Ambulatory Visit
Admission: RE | Admit: 2019-02-23 | Payer: Managed Care, Other (non HMO) | Source: Home / Self Care | Admitting: Internal Medicine

## 2019-02-23 ENCOUNTER — Encounter: Admission: RE | Payer: Self-pay | Source: Home / Self Care

## 2019-02-23 SURGERY — COLONOSCOPY WITH PROPOFOL
Anesthesia: General

## 2019-08-25 ENCOUNTER — Other Ambulatory Visit: Payer: Managed Care, Other (non HMO)

## 2019-08-28 ENCOUNTER — Ambulatory Visit: Payer: Medicare HMO | Admitting: Podiatry

## 2019-08-28 ENCOUNTER — Encounter: Payer: Self-pay | Admitting: Podiatry

## 2019-08-28 ENCOUNTER — Other Ambulatory Visit: Payer: Self-pay

## 2019-08-28 ENCOUNTER — Ambulatory Visit (INDEPENDENT_AMBULATORY_CARE_PROVIDER_SITE_OTHER): Payer: Medicare HMO

## 2019-08-28 DIAGNOSIS — M722 Plantar fascial fibromatosis: Secondary | ICD-10-CM | POA: Diagnosis not present

## 2019-08-28 NOTE — Progress Notes (Signed)
   Subjective: 64 y.o. female as a reestablish new patient for evaluation of left foot pain is been going on approximately 1 week now.  Patient states she experiences heel pain that extends into the arch.  The pain began when she wore a different pair of shoes that she has not worn in a long time.  She does have a history of wearing orthotics which do help.  Over the last 5 days there has been some modest improvement of the pain.   Past Medical History:  Diagnosis Date  . Anxiety   . Cholestanol storage disease   . Chronic kidney disease   . Diabetes mellitus without complication ([place])   . GERD (gastroesophageal reflux disease)   . High cholesterol   . Hypertension   . Sleep apnea    C-PAP     Objective: Physical Exam General: The patient is alert and oriented x3 in no acute distress.  Dermatology: Skin is warm, dry and supple bilateral lower extremities. Negative for open lesions or macerations bilateral.   Vascular: Dorsalis Pedis and Posterior Tibial pulses palpable bilateral.  Capillary fill time is immediate to all digits.  Neurological: Epicritic and protective threshold intact bilateral.   Musculoskeletal: Tenderness to palpation to the plantar aspect of the left heel along the plantar fascia. All other joints range of motion within normal limits bilateral. Strength 5/5 in all groups bilateral.   Radiographic exam: Normal osseous mineralization. Joint spaces preserved. No fracture/dislocation/boney destruction. No other soft tissue abnormalities or radiopaque foreign bodies.   Assessment: 1. Plantar fasciitis left foot  Plan of Care:  1. Patient evaluated. Xrays reviewed.   2. Injection of 0.5cc Celestone soluspan injected into the left plantar fascia.  3.  Patient declined oral medication or NSAIDs.  She does have diagnosed PMHx of CKD stage III 4. Instructed patient regarding therapies and modalities at home to alleviate symptoms.  The patient was contemplating new  custom orthotics however when she was informed that Medicare Medicaid will not cover orthotics she declined to be fitted for new orthotics. 5. Return to clinic in 4 weeks.     Edrick Kins, DPM [facility]  Dr. Edrick Kins, DPM    [address], [place] [address]                Office [phone]  Fax [phone]

## 2019-09-21 ENCOUNTER — Other Ambulatory Visit: Payer: Self-pay | Admitting: Family Medicine

## 2019-09-21 DIAGNOSIS — Z780 Asymptomatic menopausal state: Secondary | ICD-10-CM

## 2019-09-28 ENCOUNTER — Other Ambulatory Visit: Payer: Self-pay

## 2019-09-28 ENCOUNTER — Other Ambulatory Visit
Admission: RE | Admit: 2019-09-28 | Discharge: 2019-09-28 | Disposition: A | Discharge status: Home / Self Care | Payer: Medicare HMO | Source: Ambulatory Visit | Attending: General Surgery | Admitting: General Surgery

## 2019-09-28 DIAGNOSIS — Z20822 Contact with and (suspected) exposure to covid-19: Secondary | ICD-10-CM | POA: Diagnosis not present

## 2019-09-28 DIAGNOSIS — Z01812 Encounter for preprocedural laboratory examination: Secondary | ICD-10-CM | POA: Diagnosis present

## 2019-09-29 ENCOUNTER — Encounter: Payer: Self-pay | Admitting: General Surgery

## 2019-09-29 LAB — SARS CORONAVIRUS 2 (TAT 6-24 HRS): SARS Coronavirus 2: NEGATIVE

## 2019-09-30 ENCOUNTER — Encounter: Payer: Self-pay | Admitting: General Surgery

## 2019-09-30 ENCOUNTER — Encounter
Admission: RE | Disposition: A | Discharge status: Home / Self Care | Payer: Self-pay | Source: Home / Self Care | Attending: General Surgery

## 2019-09-30 ENCOUNTER — Ambulatory Visit
Admission: RE | Admit: 2019-09-30 | Discharge: 2019-09-30 | Disposition: A | Discharge status: Home / Self Care | Payer: Medicare HMO | Attending: General Surgery | Admitting: General Surgery

## 2019-09-30 ENCOUNTER — Ambulatory Visit: Payer: Medicare HMO | Admitting: Anesthesiology

## 2019-09-30 ENCOUNTER — Other Ambulatory Visit: Payer: Self-pay

## 2019-09-30 DIAGNOSIS — G4730 Sleep apnea, unspecified: Secondary | ICD-10-CM | POA: Insufficient documentation

## 2019-09-30 DIAGNOSIS — K5730 Diverticulosis of large intestine without perforation or abscess without bleeding: Secondary | ICD-10-CM | POA: Insufficient documentation

## 2019-09-30 DIAGNOSIS — I129 Hypertensive chronic kidney disease with stage 1 through stage 4 chronic kidney disease, or unspecified chronic kidney disease: Secondary | ICD-10-CM | POA: Diagnosis not present

## 2019-09-30 DIAGNOSIS — Z888 Allergy status to other drugs, medicaments and biological substances status: Secondary | ICD-10-CM | POA: Insufficient documentation

## 2019-09-30 DIAGNOSIS — Z885 Allergy status to narcotic agent status: Secondary | ICD-10-CM | POA: Diagnosis not present

## 2019-09-30 DIAGNOSIS — K5520 Angiodysplasia of colon without hemorrhage: Secondary | ICD-10-CM | POA: Insufficient documentation

## 2019-09-30 DIAGNOSIS — N183 Chronic kidney disease, stage 3 unspecified: Secondary | ICD-10-CM | POA: Insufficient documentation

## 2019-09-30 DIAGNOSIS — E1122 Type 2 diabetes mellitus with diabetic chronic kidney disease: Secondary | ICD-10-CM | POA: Diagnosis not present

## 2019-09-30 DIAGNOSIS — Z87891 Personal history of nicotine dependence: Secondary | ICD-10-CM | POA: Insufficient documentation

## 2019-09-30 DIAGNOSIS — E7800 Pure hypercholesterolemia, unspecified: Secondary | ICD-10-CM | POA: Insufficient documentation

## 2019-09-30 DIAGNOSIS — Z79899 Other long term (current) drug therapy: Secondary | ICD-10-CM | POA: Insufficient documentation

## 2019-09-30 DIAGNOSIS — Z86010 Personal history of colonic polyps: Secondary | ICD-10-CM | POA: Insufficient documentation

## 2019-09-30 DIAGNOSIS — Z1211 Encounter for screening for malignant neoplasm of colon: Secondary | ICD-10-CM | POA: Diagnosis not present

## 2019-09-30 HISTORY — DX: Age-related osteoporosis without current pathological fracture: M81.0

## 2019-09-30 HISTORY — DX: Allergic rhinitis, unspecified: J30.9

## 2019-09-30 HISTORY — DX: Personal history of colon polyps, unspecified: Z86.0100

## 2019-09-30 HISTORY — DX: Personal history of colonic polyps: Z86.010

## 2019-09-30 HISTORY — PX: COLONOSCOPY WITH PROPOFOL: SHX5780

## 2019-09-30 HISTORY — DX: Methicillin resistant Staphylococcus aureus infection, unspecified site: A49.02

## 2019-09-30 HISTORY — DX: Personal history of other infectious and parasitic diseases: Z86.19

## 2019-09-30 LAB — GLUCOSE, CAPILLARY: Glucose-Capillary: 85 mg/dL (ref 70–99)

## 2019-09-30 SURGERY — COLONOSCOPY WITH PROPOFOL
Anesthesia: General

## 2019-09-30 MED ORDER — PROPOFOL 500 MG/50ML IV EMUL
INTRAVENOUS | Status: DC | PRN
  Administered 2019-09-30: 150 ug/kg/min via INTRAVENOUS

## 2019-09-30 MED ORDER — SODIUM CHLORIDE 0.9 % IV SOLN: INTRAVENOUS | Status: DC

## 2019-09-30 MED ORDER — PROPOFOL 500 MG/50ML IV EMUL
INTRAVENOUS | Status: AC
  Filled 2019-09-30: qty 50

## 2019-09-30 MED ORDER — PROPOFOL 10 MG/ML IV BOLUS
INTRAVENOUS | Status: DC | PRN
  Administered 2019-09-30: 30 mg via INTRAVENOUS
  Administered 2019-09-30: 50 mg via INTRAVENOUS
  Administered 2019-09-30: 20 mg via INTRAVENOUS

## 2019-09-30 MED ORDER — LIDOCAINE HCL (CARDIAC) PF 100 MG/5ML IV SOSY
PREFILLED_SYRINGE | INTRAVENOUS | Status: DC | PRN
  Administered 2019-09-30: 50 mg via INTRAVENOUS

## 2019-09-30 NOTE — Anesthesia Procedure Notes (Signed)
Date/Time: 09/30/2019 11:49 AM Performed by: Johnna Acosta, CRNA Pre-anesthesia Checklist: Patient identified, Emergency Drugs available, Suction available, Patient being monitored and Timeout performed Patient Re-evaluated:Patient Re-evaluated prior to induction Oxygen Delivery Method: Nasal cannula Preoxygenation: Pre-oxygenation with 100% oxygen Induction Type: IV induction

## 2019-09-30 NOTE — Anesthesia Preprocedure Evaluation (Signed)
Anesthesia Evaluation  Patient identified by MRN, date of birth, ID band Patient awake    Reviewed: Allergy & Precautions, H&P , NPO status , Patient's Chart, lab work & pertinent test results  History of Anesthesia Complications Negative for: history of anesthetic complications  Airway Mallampati: III  TM Distance: <3 FB Neck ROM: limited    Dental  (+) Chipped, Poor Dentition   Pulmonary sleep apnea and Continuous Positive Airway Pressure Ventilation , former smoker,    Pulmonary exam normal        Cardiovascular Exercise Tolerance: Good hypertension, (-) angina(-) Past MI and (-) DOE Normal cardiovascular exam     Neuro/Psych  Neuromuscular disease negative psych ROS   GI/Hepatic Neg liver ROS, GERD  Medicated and Controlled,  Endo/Other  diabetes, Type 2  Renal/GU Renal disease  negative genitourinary   Musculoskeletal   Abdominal   Peds  Hematology negative hematology ROS (+)   Anesthesia Other Findings Past Medical History: No date: Anxiety No date: Cholestanol storage disease No date: Chronic kidney disease     Comment:  STAGE 3 No date: Diabetes mellitus without complication (HCC)     Comment:  Not taking medicine No date: GERD (gastroesophageal reflux disease) No date: High cholesterol No date: History of colon polyps No date: History of shingles No date: Hypertension No date: MRSA (methicillin resistant Staphylococcus aureus) No date: Osteoporosis No date: Rhinitis, allergic No date: Sleep apnea     Comment:  C-PAP  Past Surgical History: 07/12/10: BACK SURGERY     Comment:  Dr. Arnoldo Morale 04/13/2015: COLONOSCOPY WITH PROPOFOL; N/A     Comment:  Procedure: COLONOSCOPY WITH PROPOFOL;  Surgeon: Manya Silvas, MD;  Location: [facility] ENDOSCOPY;  Service:               Endoscopy;  Laterality: N/A; No date: DILATION AND CURETTAGE OF UTERUS 07/11/2015: HYSTEROSCOPY WITH D & C; N/A      Comment:  Procedure: DILATATION AND CURETTAGE /HYSTEROSCOPY;                Surgeon: Brayton Mars, MD;  Location: [facility] ORS;                Service: Gynecology;  Laterality: N/A; 08/01/12: KNEE SURGERY; Right     Comment:  Dr. Marry Guan ,[facility] No date: TUBAL LIGATION     Comment:  x2     Reproductive/Obstetrics negative OB ROS                             Anesthesia Physical Anesthesia Plan  ASA: III  Anesthesia Plan: General   Post-op Pain Management:    Induction: Intravenous  PONV Risk Score and Plan: Propofol infusion and TIVA  Airway Management Planned: Natural Airway and Nasal Cannula  Additional Equipment:   Intra-op Plan:   Post-operative Plan:   Informed Consent: I have reviewed the patients History and Physical, chart, labs and discussed the procedure including the risks, benefits and alternatives for the proposed anesthesia with the patient or authorized representative who has indicated his/her understanding and acceptance.     Dental Advisory Given  Plan Discussed with: Anesthesiologist, CRNA and Surgeon  Anesthesia Plan Comments: (Patient consented for risks of anesthesia including but not limited to:  - adverse reactions to medications - risk of intubation if required - damage to eyes, teeth, lips or other oral mucosa -  nerve damage due to positioning  - sore throat or hoarseness - Damage to heart, brain, nerves, lungs, other parts of body or loss of life  Patient voiced understanding.)        Anesthesia Quick Evaluation

## 2019-09-30 NOTE — Op Note (Signed)
[facility] Patient Name: Lindsey Caldwell Procedure Date: 09/30/2019 11:41 AM MRN: [id number] Account #: [id number] Date of Birth: 02/09/1956 Admit Type: Outpatient Age: 64 Room: [facility] ENDO ROOM 1 Gender: Female Note Status: Finalized Procedure:             Colonoscopy Indications:           High risk colon cancer surveillance: Personal history                         of colonic polyps Providers:             Robert Bellow, MD Referring MD:          Rubbie Battiest. Iona Beard MD, MD (Referring MD) Medicines:             Monitored Anesthesia Care Complications:         No immediate complications. Procedure:             Pre-Anesthesia Assessment:                        - Prior to the procedure, a History and Physical was                         performed, and patient medications, allergies and                         sensitivities were reviewed. The patient's tolerance                         of previous anesthesia was reviewed.                        - The risks and benefits of the procedure and the                         sedation options and risks were discussed with the                         patient. All questions were answered and informed                         consent was obtained.                        After obtaining informed consent, the colonoscope was                         passed under direct vision. Throughout the procedure,                         the patient's blood pressure, pulse, and oxygen                         saturations were monitored continuously. The                         Colonoscope was introduced through the anus and                         advanced to the the  cecum, identified by appendiceal                         orifice and ileocecal valve. The colonoscopy was                         somewhat difficult due to a tortuous colon. Successful                         completion of the procedure was aided by using manual                          pressure. The patient tolerated the procedure well.                         The quality of the bowel preparation was good. Findings:      A single small localized angioectasia without bleeding was found in the       ascending colon.      A few small-mouthed diverticula were found in the sigmoid colon.      The retroflexed view of the distal rectum and anal verge was normal and       showed no anal or rectal abnormalities. Impression:            - A single non-bleeding colonic angioectasia.                        - Diverticulosis in the sigmoid colon.                        - The distal rectum and anal verge are normal on                         retroflexion view.                        - No specimens collected. Recommendation:        - Repeat colonoscopy in 5 years for surveillance. Procedure Code(s):     --- Professional ---                        [phone], Colonoscopy, flexible; diagnostic, including                         collection of specimen(s) by brushing or washing, when                         performed (separate procedure) Diagnosis Code(s):     --- Professional ---                        Z86.010, Personal history of colonic polyps                        K55.20, Angiodysplasia of colon without hemorrhage                        K57.30, Diverticulosis of large intestine without                         perforation  or abscess without bleeding CPT copyright 2019 American Medical Association. All rights reserved. The codes documented in this report are preliminary and upon coder review may  be revised to meet current compliance requirements. Robert Bellow, MD 09/30/2019 12:13:03 PM This report has been signed electronically. Number of Addenda: 0 Note Initiated On: 09/30/2019 11:41 AM Scope Withdrawal Time: 0 hours 8 minutes 59 seconds  Total Procedure Duration: 0 hours 17 minutes 45 seconds  Estimated Blood Loss:  Estimated blood loss: none.      [facility]

## 2019-09-30 NOTE — H&P (Signed)
Lindsey Caldwell [id number] 1955-04-11     HPI:   64 y/o woman with past history of colon polyps, for follow up endoscopy.   Medications Prior to Admission  Medication Sig Dispense Refill Last Dose  . allopurinol (ZYLOPRIM) 300 MG tablet Take 1 tablet (300 mg total) by mouth daily. 30 tablet 6 Past Week at Unknown time  . amLODipine (NORVASC) 5 MG tablet Take 2 tablets (10 mg total) daily by mouth. 180 tablet 3 09/30/2019 at 0545  . fluticasone (FLONASE) 50 MCG/ACT nasal spray Place 2 sprays into both nostrils daily.     . irbesartan (AVAPRO) 150 MG tablet Take 150 mg by mouth daily.  9 09/30/2019 at 0545  . lovastatin (MEVACOR) 40 MG tablet Take 0.5 tablets (20 mg total) by mouth at bedtime. 90 tablet 3 09/29/2019 at 2100  . montelukast (SINGULAIR) 10 MG tablet Take 1 tablet (10 mg total) by mouth at bedtime. 90 tablet 2 Past Week at Unknown time  . baclofen (LIORESAL) 10 MG tablet Take 5 mg by mouth 2 (two) times daily as needed. (Patient not taking: Reported on 09/30/2019)   Not Taking at Unknown time  . Dexlansoprazole 30 MG capsule Take by mouth.     Marland Kitchen glucose blood (ONETOUCH ULTRA) test strip Use 2 (two) times daily      Allergies  Allergen Reactions  . Gadolinium Derivatives Hives    Hives on chest/redness/itching after 19 cc multihance-benadryl was given per radiologist Hives on chest/redness/itching after 19 cc multihance-benadryl was given per radiologist  . Codeine Nausea And Vomiting  . Iodine Hives  . Percocet [Oxycodone-Acetaminophen] Nausea And Vomiting   Past Medical History:  Diagnosis Date  . Anxiety   . Cholestanol storage disease   . Chronic kidney disease    STAGE 3  . Diabetes mellitus without complication [facility])    Not taking medicine  . GERD (gastroesophageal reflux disease)   . High cholesterol   . History of colon polyps   . History of shingles   . Hypertension   . MRSA (methicillin resistant Staphylococcus aureus)   . Osteoporosis   . Rhinitis, allergic    . Sleep apnea    C-PAP   Past Surgical History:  Procedure Laterality Date  . BACK SURGERY  07/12/10   Dr. Arnoldo Morale  . COLONOSCOPY WITH PROPOFOL N/A 04/13/2015   Procedure: COLONOSCOPY WITH PROPOFOL;  Surgeon: Manya Silvas, MD;  Location: [facility] ENDOSCOPY;  Service: Endoscopy;  Laterality: N/A;  . DILATION AND CURETTAGE OF UTERUS    . HYSTEROSCOPY WITH D & C N/A 07/11/2015   Procedure: DILATATION AND CURETTAGE /HYSTEROSCOPY;  Surgeon: Brayton Mars, MD;  Location: [facility] ORS;  Service: Gynecology;  Laterality: N/A;  . KNEE SURGERY Right 08/01/12   Dr. Marry Guan ,[facility]  . TUBAL LIGATION     x2   Social History   Socioeconomic History  . Marital status: Married    Spouse name: Not on file  . Number of children: Not on file  . Years of education: Not on file  . Highest education level: Not on file  Occupational History  . Not on file  Tobacco Use  . Smoking status: Former Research scientist (life sciences)  . Smokeless tobacco: Never Used  . Tobacco comment: quit 16 years ago   Vaping Use  . Vaping Use: Never used  Substance and Sexual Activity  . Alcohol use: No  . Drug use: No  . Sexual activity: Yes    Birth control/protection: Post-menopausal  Other Topics Concern  .  Not on file  Social History Narrative   Lives in [place] with husband and granddaughter. Has 2 sons. One lives in [place] and one in [place]. No pets.      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       Walks everyday for back   Social Determinants of Health   Financial Resource Strain:   . Difficulty of Paying Living Expenses:   Food Insecurity:   . Worried About Charity fundraiser in the Last Year:   . Arboriculturist in the Last Year:   Transportation Needs:   . Film/video editor (Medical):   Marland Kitchen Lack of Transportation (Non-Medical):   Physical Activity:   . Days of Exercise per Week:   . Minutes of Exercise per Session:   Stress:   . Feeling of Stress :    Social Connections:   . Frequency of Communication with Friends and Family:   . Frequency of Social Gatherings with Friends and Family:   . Attends Religious Services:   . Active Member of Clubs or Organizations:   . Attends Archivist Meetings:   Marland Kitchen Marital Status:   Intimate Partner Violence:   . Fear of Current or Ex-Partner:   . Emotionally Abused:   Marland Kitchen Physically Abused:   . Sexually Abused:    Social History   Social History Narrative   Lives in [place] with husband and granddaughter. Has 2 sons. One lives in [place] and one in [place]. No pets.      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       Walks everyday for back     ROS: Negative.     PE: HEENT: Negative. Lungs: Clear. Cardio: RR.  Assessment/Plan:   04/13/2015 endoscopy:  DIAGNOSIS:  A. COLON POLYP, TRANSVERSE; COLD BIOPSY:  - POLYPOID COLONIC MUCOSA WITHIN NORMAL LIMITS.   B. COLON POLYPS 3, TRANSVERSE; COLD BIOPSY:  - TUBULAR ADENOMAS (3).  - NEGATIVE FOR HIGH-GRADE DYSPLASIA AND MALIGNANCY.    Proceed with planned endoscopy.    [facility] 09/30/2019

## 2019-09-30 NOTE — Transfer of Care (Signed)
Immediate Anesthesia Transfer of Care Note  Patient: Lindsey Caldwell  Procedure(s) Performed: COLONOSCOPY WITH PROPOFOL (N/A )  Patient Location: PACU  Anesthesia Type:General  Level of Consciousness: awake and alert   Airway & Oxygen Therapy: Patient Spontanous Breathing  Post-op Assessment: Report given to RN and Post -op Vital signs reviewed and stable  Post vital signs: Reviewed and stable  Last Vitals:  Vitals Value Taken Time  BP 107/53 09/30/19 1216  Temp    Pulse 94 09/30/19 1217  Resp 24 09/30/19 1217  SpO2 98 % 09/30/19 1217  Vitals shown include unvalidated device data.  Last Pain:  Vitals:   09/30/19 1135  PainSc: 0-No pain         Complications: No complications documented.

## 2019-10-01 ENCOUNTER — Encounter: Payer: Self-pay | Admitting: General Surgery

## 2019-10-02 NOTE — Anesthesia Postprocedure Evaluation (Addendum)
Anesthesia Post Note  Patient: Lindsey Caldwell  Procedure(s) Performed: COLONOSCOPY WITH PROPOFOL (N/A )  Patient location during evaluation: Endoscopy Anesthesia Type: General Level of consciousness: awake and alert Pain management: pain level controlled Vital Signs Assessment: post-procedure vital signs reviewed and stable Respiratory status: spontaneous breathing, nonlabored ventilation, respiratory function stable and patient connected to nasal cannula oxygen Cardiovascular status: blood pressure returned to baseline and stable Postop Assessment: no apparent nausea or vomiting Anesthetic complications: no   No complications documented.   Last Vitals:  Vitals:   09/30/19 1226 09/30/19 1236  BP: 120/60 128/70  Pulse: 78 78  Resp: 17 (!) 22  Temp:    SpO2: 97% 98%    Last Pain:  Vitals:   10/01/19 0733  TempSrc:   PainSc: 0-No pain                 Precious Haws Jeffery Gammell

## 2019-12-01 ENCOUNTER — Other Ambulatory Visit: Payer: Self-pay

## 2019-12-01 ENCOUNTER — Ambulatory Visit: Payer: Medicare HMO | Admitting: Podiatry

## 2019-12-01 DIAGNOSIS — M722 Plantar fascial fibromatosis: Secondary | ICD-10-CM

## 2019-12-01 NOTE — Progress Notes (Signed)
   Subjective: 64 y.o. female for follow-up evaluation of left foot pain.  Patient states that the steroid injection she received last visit helped significantly.  She states that over the past month she has had a recurrence of the pain.  However it is significantly better since last visit.  She presents for further treatment evaluation   Past Medical History:  Diagnosis Date  . Anxiety   . Cholestanol storage disease   . Chronic kidney disease    STAGE 3  . Diabetes mellitus without complication [facility])    Not taking medicine  . GERD (gastroesophageal reflux disease)   . High cholesterol   . History of colon polyps   . History of shingles   . Hypertension   . MRSA (methicillin resistant Staphylococcus aureus)   . Osteoporosis   . Rhinitis, allergic   . Sleep apnea    C-PAP     Objective: Physical Exam General: The patient is alert and oriented x3 in no acute distress.  Dermatology: Skin is warm, dry and supple bilateral lower extremities. Negative for open lesions or macerations bilateral.   Vascular: Dorsalis Pedis and Posterior Tibial pulses palpable bilateral.  Capillary fill time is immediate to all digits.  Neurological: Epicritic and protective threshold intact bilateral.   Musculoskeletal: Tenderness to palpation to the plantar aspect of the left heel along the plantar fascia. All other joints range of motion within normal limits bilateral. Strength 5/5 in all groups bilateral.   Assessment: 1. Plantar fasciitis left foot  Plan of Care:  1. Patient evaluated.  2. Injection of 0.5cc Celestone soluspan injected into the left plantar fascia.  3.  Patient declined oral medication or NSAIDs.  She does have diagnosed PMHx of CKD stage III 4. Instructed patient regarding therapies and modalities at home to alleviate symptoms.  The patient was contemplating new custom orthotics however when she was informed that Medicare Medicaid will not cover orthotics she declined to be  fitted for new orthotics. 5. Return to clinic in 4 weeks.     Edrick Kins, DPM [facility]  Dr. Edrick Kins, DPM    [address], [place] [address]                Office [phone]  Fax [phone]

## 2019-12-23 ENCOUNTER — Other Ambulatory Visit: Payer: Self-pay | Admitting: Family Medicine

## 2019-12-23 DIAGNOSIS — Z1231 Encounter for screening mammogram for malignant neoplasm of breast: Secondary | ICD-10-CM

## 2020-02-03 ENCOUNTER — Ambulatory Visit
Admission: RE | Admit: 2020-02-03 | Discharge: 2020-02-03 | Disposition: A | Discharge status: Home / Self Care | Payer: Medicare HMO | Source: Ambulatory Visit | Attending: Family Medicine | Admitting: Family Medicine

## 2020-02-03 ENCOUNTER — Other Ambulatory Visit: Payer: Self-pay

## 2020-02-03 DIAGNOSIS — Z1231 Encounter for screening mammogram for malignant neoplasm of breast: Secondary | ICD-10-CM

## 2020-02-04 ENCOUNTER — Other Ambulatory Visit: Payer: Managed Care, Other (non HMO)

## 2020-02-04 ENCOUNTER — Ambulatory Visit: Payer: Managed Care, Other (non HMO)

## 2020-02-11 ENCOUNTER — Inpatient Hospital Stay: Admission: RE | Admit: 2020-02-11 | Payer: Managed Care, Other (non HMO) | Source: Ambulatory Visit

## 2020-03-10 ENCOUNTER — Other Ambulatory Visit: Payer: Self-pay

## 2020-03-10 ENCOUNTER — Ambulatory Visit
Admission: RE | Admit: 2020-03-10 | Discharge: 2020-03-10 | Disposition: A | Discharge status: Home / Self Care | Payer: Medicare HMO | Source: Ambulatory Visit | Attending: Family Medicine | Admitting: Family Medicine

## 2020-03-10 DIAGNOSIS — Z780 Asymptomatic menopausal state: Secondary | ICD-10-CM | POA: Insufficient documentation

## 2020-03-29 ENCOUNTER — Ambulatory Visit: Payer: Medicare HMO | Admitting: Podiatry

## 2020-04-05 ENCOUNTER — Other Ambulatory Visit: Payer: Self-pay

## 2020-04-05 ENCOUNTER — Ambulatory Visit: Payer: Medicare HMO | Admitting: Podiatry

## 2020-04-05 DIAGNOSIS — M722 Plantar fascial fibromatosis: Secondary | ICD-10-CM

## 2020-04-05 MED ORDER — BETAMETHASONE SOD PHOS & ACET 6 (3-3) MG/ML IJ SUSP
3.0000 mg | Freq: Once | INTRAMUSCULAR | Status: AC
  Administered 2020-04-05: 3 mg via INTRA_ARTICULAR

## 2020-04-05 NOTE — Progress Notes (Signed)
   Subjective: 65 y.o. female for follow-up evaluation of left foot pain.  Patient states that the injection she received last visit helped for approximately 1 month.  She has not been dealing with this pain to the left heel for over 1 year now.  She would like more definitive treatment options.  She presents for further treatment and evaluation  Past Medical History:  Diagnosis Date  . Anxiety   . Cholestanol storage disease   . Chronic kidney disease    STAGE 3  . Diabetes mellitus without complication [facility])    Not taking medicine  . GERD (gastroesophageal reflux disease)   . High cholesterol   . History of colon polyps   . History of shingles   . Hypertension   . MRSA (methicillin resistant Staphylococcus aureus)   . Osteoporosis   . Rhinitis, allergic   . Sleep apnea    C-PAP     Objective: Physical Exam General: The patient is alert and oriented x3 in no acute distress.  Dermatology: Skin is warm, dry and supple bilateral lower extremities. Negative for open lesions or macerations bilateral.   Vascular: Dorsalis Pedis and Posterior Tibial pulses palpable bilateral.  Capillary fill time is immediate to all digits.  Neurological: Epicritic and protective threshold intact bilateral.   Musculoskeletal: Tenderness to palpation to the plantar aspect of the left heel along the plantar fascia. All other joints range of motion within normal limits bilateral. Strength 5/5 in all groups bilateral.   Assessment: 1. Plantar fasciitis left foot  Plan of Care:  1. Patient evaluated.  2. Injection of 0.5cc Celestone soluspan injected into the left plantar fascia.  3.  Patient declined oral medication or NSAIDs.  She does have diagnosed PMHx of CKD stage III 4. Today we discussed the conservative versus surgical management of the presenting pathology. The patient opts for surgical management. All possible complications and details of the procedure were explained. All patient questions  were answered. No guarantees were expressed or implied. 5. Authorization for surgery was initiated today. Surgery will consist of endoscopic plantar fasciotomy left 6.  Return to clinic 1 week postop      Edrick Kins, DPM [facility]  Dr. Edrick Kins, DPM    [address], [place] [address]                Office [phone]  Fax [phone]

## 2020-05-05 ENCOUNTER — Telehealth: Payer: Self-pay | Admitting: Urology

## 2020-05-05 NOTE — Telephone Encounter (Signed)
DOS: 05/26/20  EPF LEFT ---[id number]  PER [employer] ([employer] INSURANCE) NO PRIOR AUTH IS REQUIRED FOR CPT CODE 59458.   Transaction ID: [id number]

## 2020-05-26 ENCOUNTER — Other Ambulatory Visit: Payer: Self-pay | Admitting: Podiatry

## 2020-05-26 ENCOUNTER — Encounter: Payer: Self-pay | Admitting: Podiatry

## 2020-05-26 DIAGNOSIS — M722 Plantar fascial fibromatosis: Secondary | ICD-10-CM

## 2020-05-26 MED ORDER — TRAMADOL HCL 50 MG PO TABS: 50.0000 mg | ORAL_TABLET | ORAL | 0 refills | Status: AC | PRN

## 2020-05-26 NOTE — Progress Notes (Signed)
PRN postop 

## 2020-05-27 ENCOUNTER — Telehealth: Payer: Self-pay | Admitting: Podiatry

## 2020-05-27 ENCOUNTER — Other Ambulatory Visit: Payer: Self-pay | Admitting: Podiatry

## 2020-05-27 MED ORDER — PROMETHAZINE HCL 12.5 MG PO TABS: 12.5000 mg | ORAL_TABLET | Freq: Three times a day (TID) | ORAL | 0 refills | Status: AC | PRN

## 2020-05-27 NOTE — Telephone Encounter (Signed)
Phenergan sent to pharmacy

## 2020-05-27 NOTE — Progress Notes (Signed)
PRN postop 

## 2020-05-27 NOTE — Telephone Encounter (Signed)
Patient called stating the prescriptions that were prescribed yesterday and today were sent in to the wrong pharmacy, [facility] is the pharmacy patient uses, Please Advise

## 2020-05-27 NOTE — Telephone Encounter (Signed)
Patient called and stated she had sx yesterday and she is having some nausea. She would like nausea medication prescribed. Please advise

## 2020-06-07 ENCOUNTER — Other Ambulatory Visit: Payer: Self-pay

## 2020-06-07 ENCOUNTER — Ambulatory Visit (INDEPENDENT_AMBULATORY_CARE_PROVIDER_SITE_OTHER): Payer: Medicare HMO | Admitting: Podiatry

## 2020-06-07 DIAGNOSIS — M722 Plantar fascial fibromatosis: Secondary | ICD-10-CM

## 2020-06-07 DIAGNOSIS — Z9889 Other specified postprocedural states: Secondary | ICD-10-CM

## 2020-06-07 NOTE — Progress Notes (Signed)
   Subjective:  Patient presents today status post EPF left foot. DOS: 05/26/2020.  Patient states she is doing well.  She has been wearing the cam boot as instructed.  No new complaints at this time  Past Medical History:  Diagnosis Date  . Anxiety   . Cholestanol storage disease   . Chronic kidney disease    STAGE 3  . Diabetes mellitus without complication [facility])    Not taking medicine  . GERD (gastroesophageal reflux disease)   . High cholesterol   . History of colon polyps   . History of shingles   . Hypertension   . MRSA (methicillin resistant Staphylococcus aureus)   . Osteoporosis   . Rhinitis, allergic   . Sleep apnea    C-PAP      Objective/Physical Exam Neurovascular status intact.  Skin incisions appear to be well coapted with sutures intact. No sign of infectious process noted. No dehiscence. No active bleeding noted. Moderate edema noted to the surgical extremity.   Assessment: 1. s/p EPF left. DOS: 05/26/2020   Plan of Care:  1. Patient was evaluated.  2.  Dressings changed today.  Recommend antibiotic ointment and a Band-Aid to the small incision sites 3.  Recommend Ace wrap daily.  Ace wrap provided 4.  Continue weightbearing in the cam boot x1 additional week 5.  Return to clinic in 1 week for suture removal   Edrick Kins, DPM [facility]  Dr. Edrick Kins, DPM    [address],  [address]                Office [phone]  Fax [phone]

## 2020-06-14 ENCOUNTER — Other Ambulatory Visit: Payer: Self-pay

## 2020-06-14 ENCOUNTER — Ambulatory Visit (INDEPENDENT_AMBULATORY_CARE_PROVIDER_SITE_OTHER): Payer: Medicare HMO | Admitting: Podiatry

## 2020-06-14 DIAGNOSIS — Z9889 Other specified postprocedural states: Secondary | ICD-10-CM

## 2020-06-14 DIAGNOSIS — M722 Plantar fascial fibromatosis: Secondary | ICD-10-CM

## 2020-06-14 NOTE — Progress Notes (Signed)
   Subjective:  Patient presents today status post EPF left foot. DOS: 05/26/2020.  Patient states she is doing well.  She has been wearing the cam boot as instructed.  No new complaints at this time  Past Medical History:  Diagnosis Date  . Anxiety   . Cholestanol storage disease   . Chronic kidney disease    STAGE 3  . Diabetes mellitus without complication [facility])    Not taking medicine  . GERD (gastroesophageal reflux disease)   . High cholesterol   . History of colon polyps   . History of shingles   . Hypertension   . MRSA (methicillin resistant Staphylococcus aureus)   . Osteoporosis   . Rhinitis, allergic   . Sleep apnea    C-PAP      Objective/Physical Exam Neurovascular status intact.  Skin incisions appear to be well coapted with sutures intact. No sign of infectious process noted. No dehiscence. No active bleeding noted.  Negative for any significant edema to the surgical extremity.   Assessment: 1. s/p EPF left. DOS: 05/26/2020   Plan of Care:  1. Patient was evaluated.  Sutures removed today 2.  Compression ankle sleeve dispensed.  Wear daily 3.  Patient may transition out of the cam boot into good supportive sneakers over the next 2 weeks 4.  Return to clinic in 4 weeks   Edrick Kins, DPM [facility]  Dr. Edrick Kins, DPM    [address], [place] [address]                Office [phone]  Fax [phone]

## 2020-06-28 ENCOUNTER — Encounter: Payer: Medicare HMO | Admitting: Podiatry

## 2020-07-12 ENCOUNTER — Encounter: Payer: Self-pay | Admitting: Podiatry

## 2020-07-12 ENCOUNTER — Other Ambulatory Visit: Payer: Self-pay

## 2020-07-12 ENCOUNTER — Ambulatory Visit (INDEPENDENT_AMBULATORY_CARE_PROVIDER_SITE_OTHER): Payer: Medicare HMO | Admitting: Podiatry

## 2020-07-12 DIAGNOSIS — Z9889 Other specified postprocedural states: Secondary | ICD-10-CM

## 2020-07-12 NOTE — Progress Notes (Signed)
   Subjective:  Patient presents today status post EPF left foot. DOS: 05/26/2020.  Patient states she is doing well.  She has been wearing her tennis shoes without any new complaints.  She has minimal pain.  Overall she is doing very well  Past Medical History:  Diagnosis Date  . Anxiety   . Cholestanol storage disease   . Chronic kidney disease    STAGE 3  . Diabetes mellitus without complication [facility])    Not taking medicine  . GERD (gastroesophageal reflux disease)   . High cholesterol   . History of colon polyps   . History of shingles   . Hypertension   . MRSA (methicillin resistant Staphylococcus aureus)   . Osteoporosis   . Rhinitis, allergic   . Sleep apnea    C-PAP      Objective/Physical Exam Neurovascular status intact.  Skin incisions appear to be well coapted and healed. No sign of infectious process noted. No dehiscence. No active bleeding noted.  Negative for any significant edema to the surgical extremity.   Assessment: 1. s/p EPF left. DOS: 05/26/2020   Plan of Care:  1. Patient was evaluated.   2.  Patient may now resume full activity no restrictions.  Recommend continue wearing good stability sneakers 3.  Continue compression ankle sleeves as needed  4.  Return to clinic as needed   Edrick Kins, DPM [facility]  Dr. Edrick Kins, DPM    [address], [place] [address]                Office [phone]  Fax [phone]

## 2021-01-02 ENCOUNTER — Other Ambulatory Visit: Payer: Self-pay | Admitting: Family Medicine

## 2021-01-02 DIAGNOSIS — Z1231 Encounter for screening mammogram for malignant neoplasm of breast: Secondary | ICD-10-CM

## 2021-02-03 ENCOUNTER — Ambulatory Visit
Admission: RE | Admit: 2021-02-03 | Discharge: 2021-02-03 | Disposition: A | Discharge status: Home / Self Care | Payer: Medicare HMO | Source: Ambulatory Visit | Attending: Family Medicine | Admitting: Family Medicine

## 2021-02-03 ENCOUNTER — Other Ambulatory Visit: Payer: Self-pay

## 2021-02-03 DIAGNOSIS — Z1231 Encounter for screening mammogram for malignant neoplasm of breast: Secondary | ICD-10-CM

## 2021-08-06 IMAGING — MG DIGITAL SCREENING BILAT W/ TOMO W/ CAD
8 series · 8 of 24 positions shown · non-contrast
Comparison: Previous exam(s).

CLINICAL DATA: Screening.

EXAM:
DIGITAL SCREENING BILATERAL MAMMOGRAM WITH TOMO AND CAD

[L MLO synth-2D]
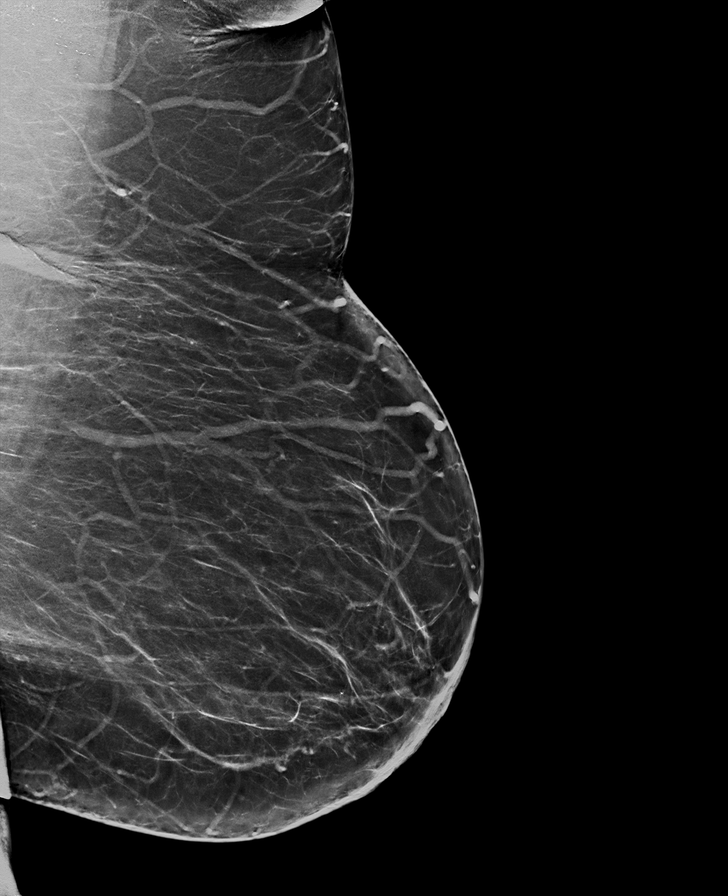

[L CC synth-2D]
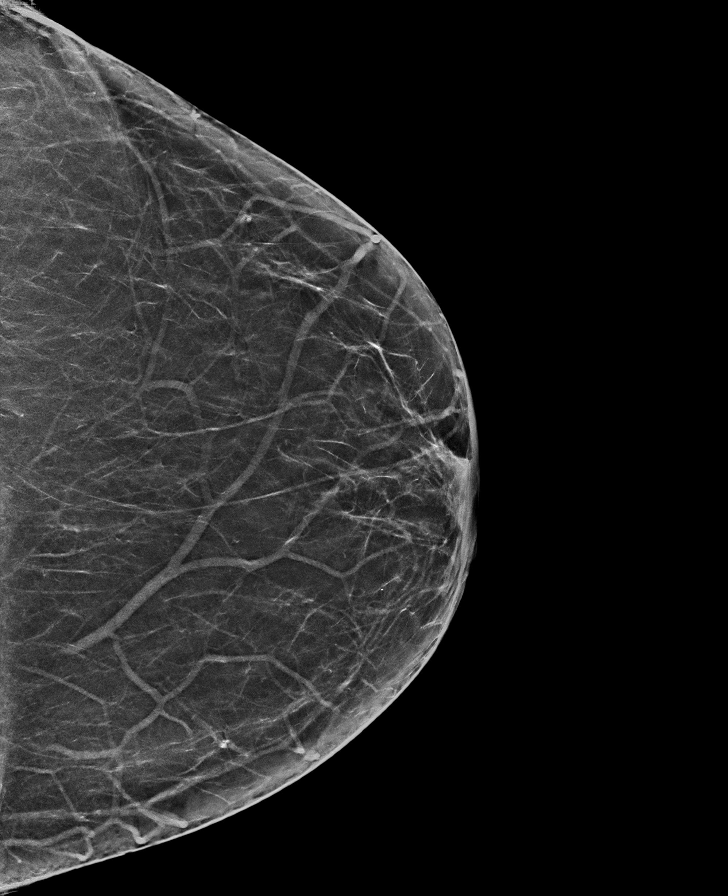

[R MLO synth-2D]
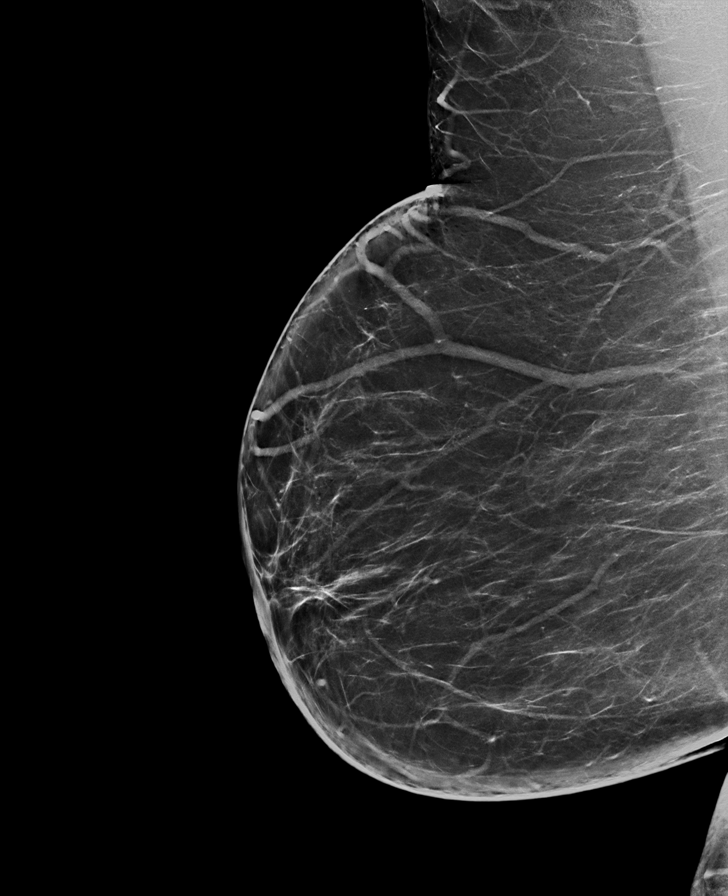

[R CC synth-2D]
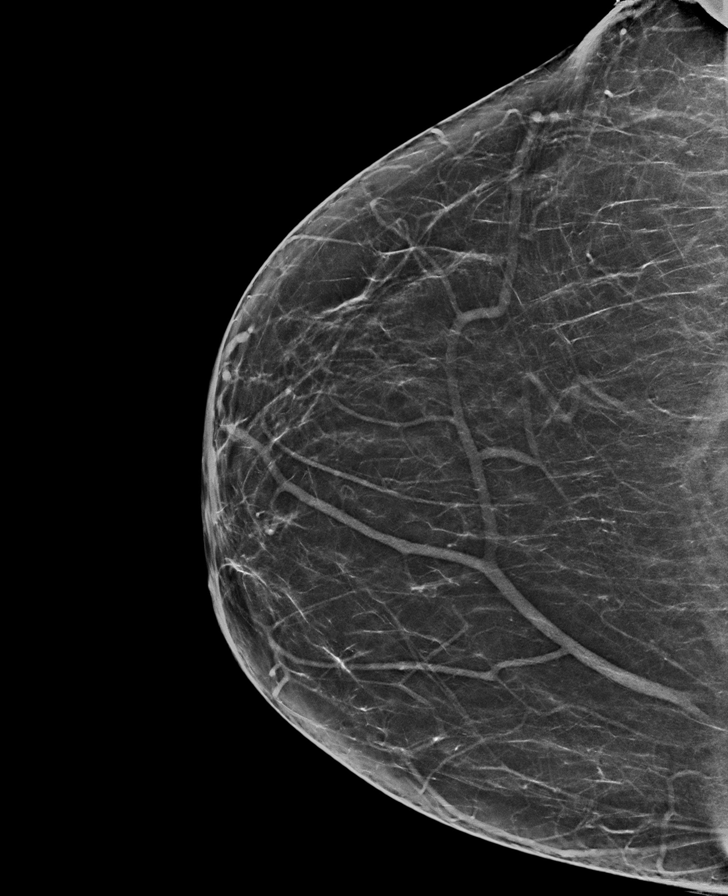

[R CC tomo · tomo slice 35/70.0]
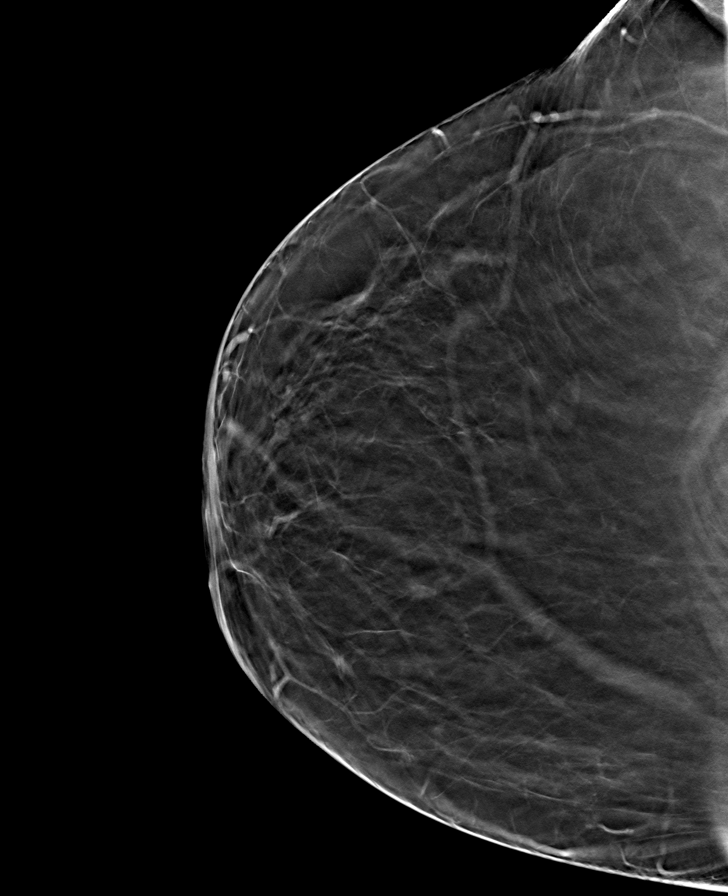

[R MLO tomo · tomo slice 42/83.0]
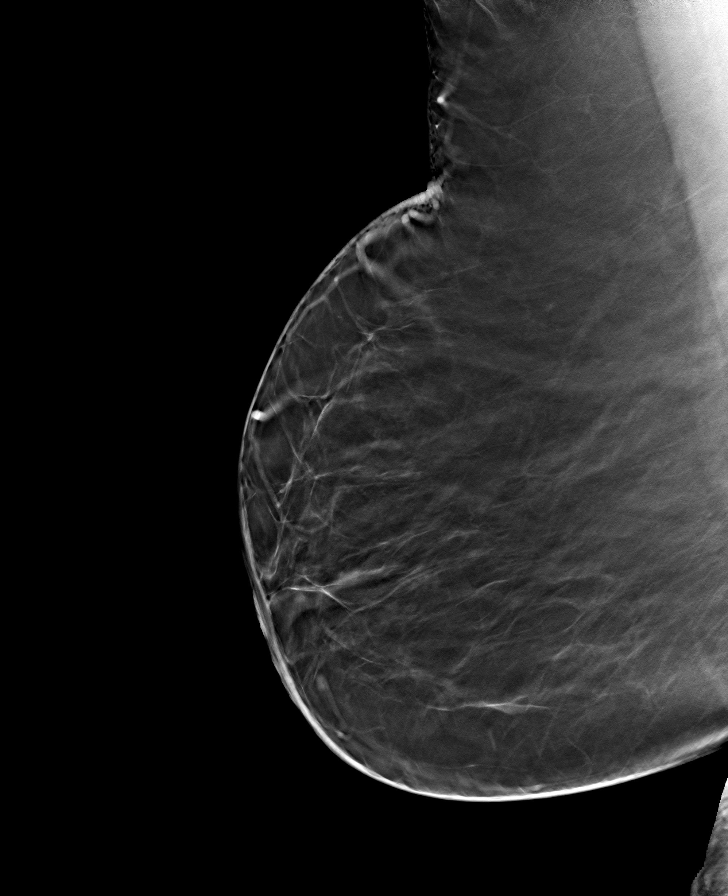

[L CC tomo · tomo slice 33/66.0]
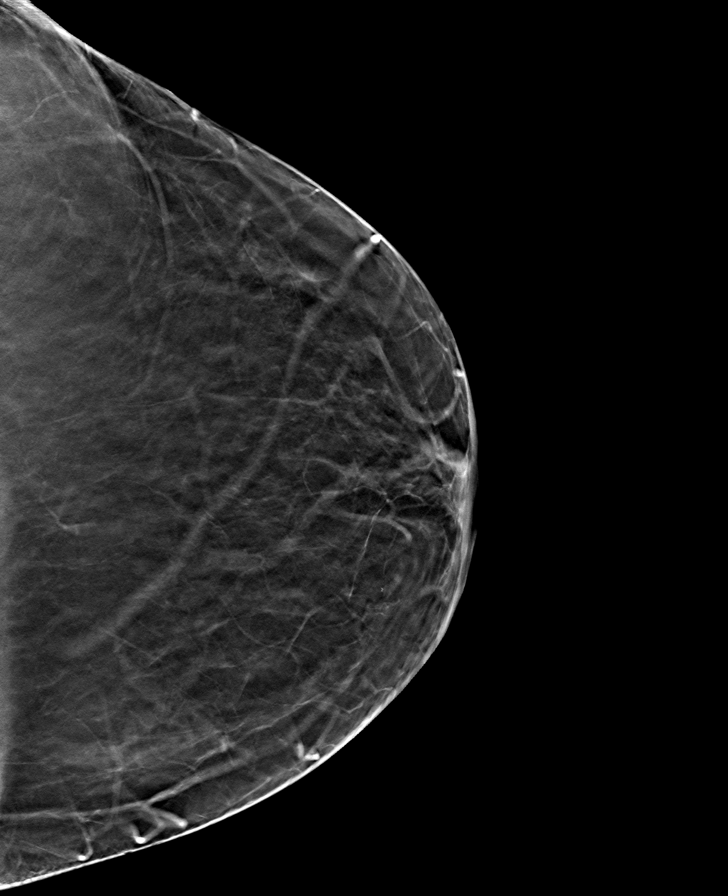

[L MLO tomo · tomo slice 43/84.0]
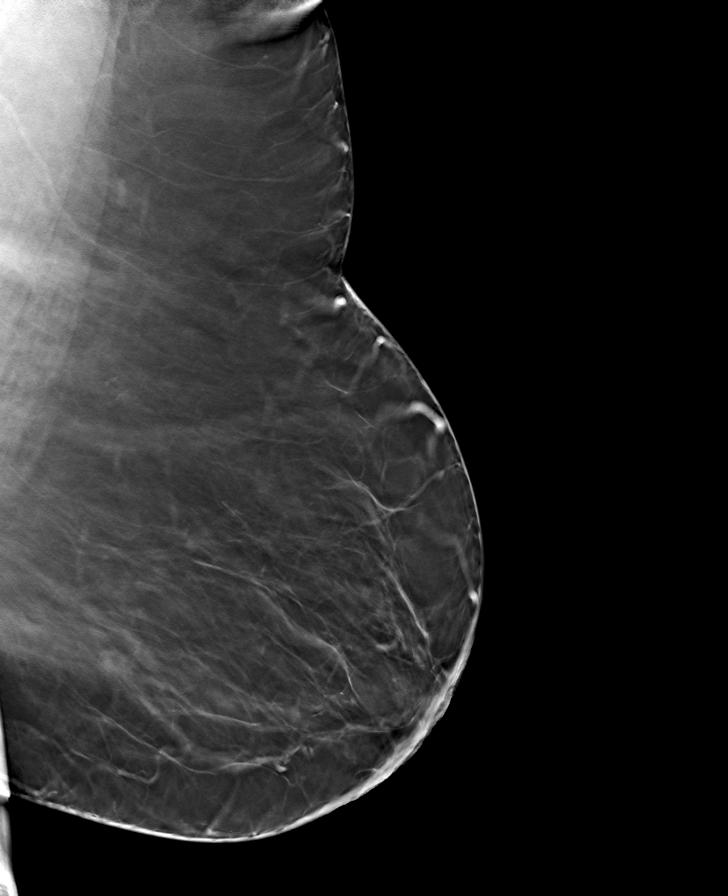

[8 of 24 positions shown; findings below may reference images not displayed]

ACR Breast Density Category b: There are scattered areas of
fibroglandular density.
FINDINGS: There are no findings suspicious for malignancy. Images were
processed with CAD.
IMPRESSION: No mammographic evidence of malignancy. A result letter of this
screening mammogram will be mailed directly to the patient.

RECOMMENDATION:
Screening mammogram in one year. (Code:[id number])

BI-RADS CATEGORY  1: Negative.

## 2021-09-12 ENCOUNTER — Ambulatory Visit: Payer: Medicare HMO | Admitting: Podiatry

## 2021-09-12 DIAGNOSIS — G5763 Lesion of plantar nerve, bilateral lower limbs: Secondary | ICD-10-CM | POA: Diagnosis not present

## 2021-09-12 MED ORDER — GABAPENTIN 100 MG PO CAPS: 100.0000 mg | ORAL_CAPSULE | Freq: Two times a day (BID) | ORAL | 1 refills | Status: AC

## 2021-09-12 MED ORDER — METHYLPREDNISOLONE 4 MG PO TBPK: ORAL_TABLET | ORAL | 0 refills | Status: DC

## 2021-09-12 NOTE — Progress Notes (Signed)
Chief Complaint  Patient presents with   Foot Pain    Patient is here complaining of  bilateral foot pain, the bottom of the the left foot is worse.    HPI: 66 y.o. female presenting today for new complaint of pain to the bilateral forefoot this been going on now for a few months intermittently.  Patient states that if she is on her feet for prolonged period of time she begins to experience pain and tenderness to the bilateral forefoot.  She wears Brooks running shoes.  She denies a history of injury.  Presenting for further treatment and evaluation  Past Medical History:  Diagnosis Date   Anxiety    Cholestanol storage disease    Chronic kidney disease    STAGE 3   Diabetes mellitus without complication ([place])    Not taking medicine   GERD (gastroesophageal reflux disease)    High cholesterol    History of colon polyps    History of shingles    Hypertension    MRSA (methicillin resistant Staphylococcus aureus)    Osteoporosis    Rhinitis, allergic    Sleep apnea    C-PAP    Past Surgical History:  Procedure Laterality Date   BACK SURGERY  07/12/10   Dr. Arnoldo Morale   COLONOSCOPY WITH PROPOFOL N/A 04/13/2015   Procedure: COLONOSCOPY WITH PROPOFOL;  Surgeon: Manya Silvas, MD;  Location: [facility];  Service: Endoscopy;  Laterality: N/A;   COLONOSCOPY WITH PROPOFOL N/A 09/30/2019   Procedure: COLONOSCOPY WITH PROPOFOL;  Surgeon: Robert Bellow, MD;  Location: [facility];  Service: Endoscopy;  Laterality: N/A;   DILATION AND CURETTAGE OF UTERUS     HYSTEROSCOPY WITH D & C N/A 07/11/2015   Procedure: DILATATION AND CURETTAGE /HYSTEROSCOPY;  Surgeon: Brayton Mars, MD;  Location: [facility];  Service: Gynecology;  Laterality: N/A;   KNEE SURGERY Right 08/01/12   Dr. Marry Guan ,[place]     x2    Allergies  Allergen Reactions   Gadolinium Derivatives Hives    Hives on chest/redness/itching after 19 cc multihance-benadryl was given per  radiologist Hives on chest/redness/itching after 19 cc multihance-benadryl was given per radiologist   Codeine Nausea And Vomiting   Escitalopram Other (See Comments)    Excessive sweating   Iodine Hives   Percocet [Oxycodone-Acetaminophen] Nausea And Vomiting     Physical Exam: General: The patient is alert and oriented x3 in no acute distress.  Dermatology: Skin is warm, dry and supple bilateral lower extremities. Negative for open lesions or macerations.  Vascular: Palpable pedal pulses bilaterally. Capillary refill within normal limits.  Negative for any significant edema or erythema  Neurological: Light touch and protective threshold grossly intact  Musculoskeletal Exam: No pedal deformities noted.  There is some tenderness to palpation along the bilateral forefoot.  There is no specific pinpoint area of pain  Assessment: 1.  Morton's metatarsalgia bilateral   Plan of Care:  1. Patient evaluated. 2.  Prescription for Medrol Dosepak 3.  Patient cannot take NSAIDs secondary to CKD stage III 4.  Prescription for gabapentin 100 mg twice daily 5.  Continue wearing Brooks running shoes.  OTC power step insoles dispensed at checkout 6.  Return to clinic as needed     Edrick Kins, DPM [facility]  Dr. Edrick Kins, DPM    [address]  [place], [place] [address]                Office [phone]  Fax [phone]

## 2022-01-03 ENCOUNTER — Other Ambulatory Visit: Payer: Self-pay

## 2022-01-03 DIAGNOSIS — Z1231 Encounter for screening mammogram for malignant neoplasm of breast: Secondary | ICD-10-CM

## 2022-02-05 ENCOUNTER — Ambulatory Visit
Admission: RE | Admit: 2022-02-05 | Discharge: 2022-02-05 | Disposition: A | Discharge status: Home / Self Care | Payer: Medicare HMO | Source: Ambulatory Visit | Attending: Family Medicine | Admitting: Family Medicine

## 2022-02-05 DIAGNOSIS — Z1231 Encounter for screening mammogram for malignant neoplasm of breast: Secondary | ICD-10-CM | POA: Insufficient documentation

## 2022-05-17 ENCOUNTER — Ambulatory Visit (INDEPENDENT_AMBULATORY_CARE_PROVIDER_SITE_OTHER): Payer: Medicare HMO

## 2022-05-17 ENCOUNTER — Ambulatory Visit
Admission: EM | Admit: 2022-05-17 | Discharge: 2022-05-17 | Disposition: A | Discharge status: Home / Self Care | Payer: Medicare HMO | Attending: Emergency Medicine | Admitting: Emergency Medicine

## 2022-05-17 DIAGNOSIS — J069 Acute upper respiratory infection, unspecified: Secondary | ICD-10-CM

## 2022-05-17 MED ORDER — PROMETHAZINE-DM 6.25-15 MG/5ML PO SYRP: 5.0000 mL | ORAL_SOLUTION | Freq: Four times a day (QID) | ORAL | 0 refills | Status: AC | PRN

## 2022-05-17 MED ORDER — BENZONATATE 100 MG PO CAPS: 200.0000 mg | ORAL_CAPSULE | Freq: Three times a day (TID) | ORAL | 0 refills | Status: DC

## 2022-05-17 MED ORDER — IPRATROPIUM BROMIDE 0.06 % NA SOLN: 2.0000 | Freq: Four times a day (QID) | NASAL | 12 refills | Status: AC

## 2022-05-17 NOTE — ED Triage Notes (Signed)
Patient presents to UC for cough and chest congestion x 2 days. Taking robitussin.

## 2022-05-17 NOTE — ED Provider Notes (Signed)
[facility]    CSN: [id number] Arrival date & time: 05/17/22  0808      History   Chief Complaint Chief Complaint  Patient presents with   Cough    HPI Lindsey Caldwell is a 67 y.o. female.   HPI  67 year old female with significant past medical history for chronic kidney disease stage III, high cholesterol, diabetes, GERD, hypertension, and sleep apnea presents for evaluation of cough and chest congestion for the past 2 days.  She has been using over-the-counter Robitussin for her symptoms.  Past Medical History:  Diagnosis Date   Anxiety    Cholestanol storage disease    Chronic kidney disease    STAGE 3   Diabetes mellitus without complication ([place])    Not taking medicine   GERD (gastroesophageal reflux disease)    High cholesterol    History of colon polyps    History of shingles    Hypertension    MRSA (methicillin resistant Staphylococcus aureus)    Osteoporosis    Rhinitis, allergic    Sleep apnea    C-PAP    Patient Active Problem List   Diagnosis Date Noted   Hx of adenomatous colonic polyps 11/13/2018   HLD (hyperlipidemia) 04/14/2018   Situational anxiety 01/29/2018   Generalized osteoarthritis of hand 07/04/2017   Elevated rheumatoid factor 06/14/2017   Pain of right hand 06/14/2017   Anxiety state 02/01/2016   Paresthesia 08/22/2015   Fibroid, uterine 06/21/2015   Cervical stenosis (uterine cervix) 06/21/2015   Atrophic vaginitis 01/31/2015   Edema 07/08/2014   Vitamin D deficiency 06/03/2014   Routine general medical examination at a health care facility 01/25/2014   Snoring 01/25/2014   Abnormal respiratory rate 01/25/2014   Chronic kidney disease (CKD), stage III (moderate) ([place]) 09/23/2013   Diabetes type 2, controlled ([place]) 08/17/2013   Hypertension 08/17/2013   Gastroesophageal reflux disease without esophagitis 08/17/2013   Morbid obesity ([place]) 08/17/2013   Left-sided low back pain with sciatica 08/17/2013   Severe  obesity (BMI >= 40) ([place]) 08/17/2013    Past Surgical History:  Procedure Laterality Date   BACK SURGERY  07/12/10   Dr. Arnoldo Morale   COLONOSCOPY WITH PROPOFOL N/A 04/13/2015   Procedure: COLONOSCOPY WITH PROPOFOL;  Surgeon: Manya Silvas, MD;  Location: [facility];  Service: Endoscopy;  Laterality: N/A;   COLONOSCOPY WITH PROPOFOL N/A 09/30/2019   Procedure: COLONOSCOPY WITH PROPOFOL;  Surgeon: Robert Bellow, MD;  Location: [facility];  Service: Endoscopy;  Laterality: N/A;   DILATION AND CURETTAGE OF UTERUS     HYSTEROSCOPY WITH D & C N/A 07/11/2015   Procedure: DILATATION AND CURETTAGE /HYSTEROSCOPY;  Surgeon: Brayton Mars, MD;  Location: [facility];  Service: Gynecology;  Laterality: N/A;   KNEE SURGERY Right 08/01/12   Dr. Marry Guan ,[place]     x2    OB History     Gravida  4   Para  1   Term  1   Preterm      AB  2   Living  2      SAB      IAB      Ectopic  2   Multiple      Live Births  2            Home Medications    Prior to Admission medications   Medication Sig Start Date End Date Taking? Authorizing Provider  benzonatate (TESSALON) 100 MG capsule Take 2 capsules (  200 mg total) by mouth every 8 (eight) hours. 05/17/22  Yes Margarette Canada, NP  ipratropium (ATROVENT) 0.06 % nasal spray Place 2 sprays into both nostrils 4 (four) times daily. 05/17/22  Yes Margarette Canada, NP  promethazine-dextromethorphan (PROMETHAZINE-DM) 6.25-15 MG/5ML syrup Take 5 mLs by mouth 4 (four) times daily as needed. 05/17/22  Yes Margarette Canada, NP  allopurinol (ZYLOPRIM) 300 MG tablet Take 1 tablet (300 mg total) by mouth daily. 11/08/15   Caryn Section Linden Dolin, PA-C  amLODipine (NORVASC) 5 MG tablet Take 2 tablets (10 mg total) daily by mouth. 01/04/17   Fisher, Linden Dolin, PA-C  baclofen (LIORESAL) 10 MG tablet Take 5 mg by mouth 2 (two) times daily as needed. 08/03/19   [provider]  Dexlansoprazole 30 MG capsule Take by mouth. 12/10/17    [provider]  fluticasone (FLONASE) 50 MCG/ACT nasal spray Place 2 sprays into both nostrils daily.    [provider]  gabapentin (NEURONTIN) 100 MG capsule Take 1 capsule (100 mg total) by mouth 2 (two) times daily. 09/12/21   Edrick Kins, DPM  irbesartan (AVAPRO) 150 MG tablet Take 150 mg by mouth daily. 08/26/17   [provider]  lovastatin (MEVACOR) 40 MG tablet Take 0.5 tablets (20 mg total) by mouth at bedtime. 02/02/16   Burnard Hawthorne, FNP  methylPREDNISolone (MEDROL DOSEPAK) 4 MG TBPK tablet 6 day dose pack - take as directed 09/12/21   Edrick Kins, DPM  montelukast (SINGULAIR) 10 MG tablet Take 1 tablet (10 mg total) by mouth at bedtime. 03/22/16   Burnard Hawthorne, FNP  promethazine (PHENERGAN) 12.5 MG tablet Take 1 tablet (12.5 mg total) by mouth every 8 (eight) hours as needed for nausea or vomiting. 05/27/20   Edrick Kins, DPM    Family History Family History  Problem Relation Age of Onset   Cancer Father        Hodgkins   Diabetes Sister    Cancer Sister        brain tumor   Cancer Sister        breast   Breast cancer Sister 56   Colon cancer Neg Hx    Ovarian cancer Neg Hx     Social History Social History   Tobacco Use   Smoking status: Former   Smokeless tobacco: Never   Tobacco comments:    quit 16 years ago   Vaping Use   Vaping Use: Never used  Substance Use Topics   Alcohol use: No   Drug use: No     Allergies   Gadolinium derivatives, Codeine, Escitalopram, Iodine, and Percocet [oxycodone-acetaminophen]   Review of Systems Review of Systems  Constitutional:  Positive for fever.       101 yesterday  HENT:  Positive for congestion, rhinorrhea and sore throat.        Itchy throat  Respiratory:  Positive for cough. Negative for shortness of breath and wheezing.        Coughing up green sputum     Physical Exam Triage Vital Signs ED Triage Vitals  Enc Vitals Group     BP 05/17/22 0816 138/73      Pulse Rate 05/17/22 0816 81     Resp 05/17/22 0816 18     Temp 05/17/22 0816 98.4 F (36.9 C)     Temp Source 05/17/22 0816 Oral     SpO2 05/17/22 0816 94 %     Weight --      Height --  Head Circumference --      Peak Flow --      Pain Score 05/17/22 0817 0     Pain Loc --      Pain Edu? --      Excl. in [place]? --    No data found.  Updated Vital Signs BP 138/73 (BP Location: Left Arm)   Pulse 81   Temp 98.4 F (36.9 C) (Oral)   Resp 18   SpO2 94%   Visual Acuity Right Eye Distance:   Left Eye Distance:   Bilateral Distance:    Right Eye Near:   Left Eye Near:    Bilateral Near:     Physical Exam Vitals and nursing note reviewed.  Constitutional:      Appearance: Normal appearance. She is obese. She is not ill-appearing.  HENT:     Head: Normocephalic and atraumatic.     Right Ear: Tympanic membrane, ear canal and external ear normal. There is no impacted cerumen.     Left Ear: Tympanic membrane, ear canal and external ear normal. There is no impacted cerumen.     Nose: Congestion and rhinorrhea present.     Comments: Nasal mucosa is erythematous and edematous with clear discharge in both nares.    Mouth/Throat:     Mouth: Mucous membranes are moist.     Pharynx: Oropharynx is clear. Posterior oropharyngeal erythema present. No oropharyngeal exudate.     Comments: Mild erythema to the posterior oropharynx with clear postnasal drip. Cardiovascular:     Rate and Rhythm: Normal rate and regular rhythm.     Pulses: Normal pulses.     Heart sounds: Normal heart sounds. No murmur heard.    No friction rub. No gallop.  Pulmonary:     Effort: Pulmonary effort is normal.     Breath sounds: Normal breath sounds. No wheezing, rhonchi or rales.     Comments: Lung sounds are decreased diffusely. Musculoskeletal:     Cervical back: Normal range of motion and neck supple.  Lymphadenopathy:     Cervical: No cervical adenopathy.  Neurological:     Mental Status: She is  alert.      UC Treatments / Results  Labs (all labs ordered are listed, but only abnormal results are displayed) Labs Reviewed - No data to display  EKG   Radiology DG Chest 2 View  Result Date: 05/17/2022 CLINICAL DATA:  Fever, cough. EXAM: CHEST - 2 VIEW COMPARISON:  April 23, 2012. FINDINGS: The heart size and mediastinal contours are within normal limits. Both lungs are clear. The visualized skeletal structures are unremarkable. IMPRESSION: No active cardiopulmonary disease. Electronically Signed   By: Marijo Conception M.D.   On: 05/17/2022 08:45    Procedures Procedures (including critical care time)  Medications Ordered in UC Medications - No data to display  Initial Impression / Assessment and Plan / UC Course  I have reviewed the triage vital signs and the nursing notes.  Pertinent labs & imaging results that were available during my care of the patient were reviewed by me and considered in my medical decision making (see chart for details).   Patient is a pleasant, nontoxic-appearing 67 year old female presenting for evaluation of upper and lower respiratory symptoms associated with a fever that started yesterday.  She does report a productive cough for a purulent sputum.  She is not in any respiratory distress in the exam room and she is able to speak in full sentence without dyspnea or tachypnea.  Her respiratory rate is 18 but her SpO2 is soft at 94% on room air.  Given that she has had a fever and productive cough I will obtain a chest x-ray to rule out pneumonia or acute cardiopulmonary process.  Radiology impression of chest x-ray states both lungs are clear and there is no active cardiopulmonary process.  I will discharge patient home with a diagnosis of viral URI with cough and treated with Atrovent nasal spray, Tessalon Perles, and Promethazine DM cough syrup.  Return precautions reviewed.  She can use over-the-counter Tylenol and/or ibuprofen as needed for fever and  pain.   Final Clinical Impressions(s) / UC Diagnoses   Final diagnoses:  Viral URI with cough     Discharge Instructions      Your chest x-ray did not demonstrate any evidence of pneumonia.  Your physical exam is consistent with an upper respiratory infection and is most likely viral given the acute onset of symptoms.  Use over-the-counter Tylenol and/or ibuprofen according to the package instructions as needed for fever and pain.  Use the Atrovent nasal spray, 2 squirts in each nostril every 6 hours, as needed for runny nose and postnasal drip.  Use the Tessalon Perles every 8 hours during the day.  Take them with a small sip of water.  They may give you some numbness to the base of your tongue or a metallic taste in your mouth, this is normal.  Use the Promethazine DM cough syrup at bedtime for cough and congestion.  It will make you drowsy so do not take it during the day.  Return for reevaluation or see your primary care provider for any new or worsening symptoms.      ED Prescriptions     Medication Sig Dispense Auth. Provider   benzonatate (TESSALON) 100 MG capsule Take 2 capsules (200 mg total) by mouth every 8 (eight) hours. 21 capsule Margarette Canada, NP   ipratropium (ATROVENT) 0.06 % nasal spray Place 2 sprays into both nostrils 4 (four) times daily. 15 mL Margarette Canada, NP   promethazine-dextromethorphan (PROMETHAZINE-DM) 6.25-15 MG/5ML syrup Take 5 mLs by mouth 4 (four) times daily as needed. 118 mL Margarette Canada, NP      PDMP not reviewed this encounter.   Margarette Canada, NP 05/17/22 [phone]

## 2022-05-17 NOTE — Discharge Instructions (Addendum)
Your chest x-ray did not demonstrate any evidence of pneumonia.  Your physical exam is consistent with an upper respiratory infection and is most likely viral given the acute onset of symptoms.  Use over-the-counter Tylenol and/or ibuprofen according to the package instructions as needed for fever and pain.  Use the Atrovent nasal spray, 2 squirts in each nostril every 6 hours, as needed for runny nose and postnasal drip.  Use the Tessalon Perles every 8 hours during the day.  Take them with a small sip of water.  They may give you some numbness to the base of your tongue or a metallic taste in your mouth, this is normal.  Use the Promethazine DM cough syrup at bedtime for cough and congestion.  It will make you drowsy so do not take it during the day.  Return for reevaluation or see your primary care provider for any new or worsening symptoms.

## 2022-10-26 ENCOUNTER — Ambulatory Visit
Admission: EM | Admit: 2022-10-26 | Discharge: 2022-10-26 | Disposition: A | Discharge status: Home / Self Care | Payer: Medicare HMO | Attending: Family Medicine | Admitting: Family Medicine

## 2022-10-26 DIAGNOSIS — H6992 Unspecified Eustachian tube disorder, left ear: Secondary | ICD-10-CM

## 2022-10-26 DIAGNOSIS — I10 Essential (primary) hypertension: Secondary | ICD-10-CM | POA: Diagnosis not present

## 2022-10-26 DIAGNOSIS — J302 Other seasonal allergic rhinitis: Secondary | ICD-10-CM | POA: Diagnosis not present

## 2022-10-26 DIAGNOSIS — R42 Dizziness and giddiness: Secondary | ICD-10-CM | POA: Diagnosis not present

## 2022-10-26 DIAGNOSIS — J0100 Acute maxillary sinusitis, unspecified: Secondary | ICD-10-CM

## 2022-10-26 LAB — GLUCOSE, CAPILLARY: Glucose-Capillary: 101 mg/dL — ABNORMAL HIGH (ref 70–99)

## 2022-10-26 MED ORDER — PREDNISONE 10 MG (21) PO TBPK: ORAL_TABLET | Freq: Every day | ORAL | 0 refills | Status: AC

## 2022-10-26 MED ORDER — AZITHROMYCIN 250 MG PO TABS: ORAL_TABLET | ORAL | 0 refills | Status: AC

## 2022-10-26 NOTE — Discharge Instructions (Signed)
Stop by the pharmacy to pick up your prescriptions.  Follow up with your primary care provider as needed.  Go to ED for red flag symptoms, including; fevers you cannot reduce with Tylenol/Motrin, severe headaches, vision changes, numbness/weakness in part of the body, lethargy, confusion, intractable vomiting, severe dehydration, chest pain, breathing difficulty, severe persistent abdominal or pelvic pain, signs of severe infection (increased redness, swelling of an area), feeling faint or passing out, dizziness, etc. You should especially go to the ED for sudden acute worsening of condition if you do not elect to go at this time.  Go to ED for red flag symptoms, including; fevers you cannot reduce with Tylenol/Motrin, severe headaches, vision changes, numbness/weakness in part of the body, lethargy, confusion, intractable vomiting, severe dehydration, chest pain, breathing difficulty, severe persistent abdominal or pelvic pain, signs of severe infection (increased redness, swelling of an area), feeling faint or passing out, dizziness, etc. You should especially go to the ED for sudden acute worsening of condition if you do not elect to go at this time.

## 2022-10-26 NOTE — ED Triage Notes (Signed)
Pt c/o Headache x2weeks  Pt states that the dizziness happened 3 times last week and has not happened this week.   Pt denies any chest pain or SOB  Pt states the headaches appear at random and normally lasts about 30 min to a few hours. Pt states that they are never the same.

## 2022-10-26 NOTE — ED Provider Notes (Signed)
[facility]    CSN: [id number] Arrival date & time: 10/26/22  3244      History   Chief Complaint Chief Complaint  Patient presents with   Headache    HPI [name] is a 67 y.o. female.   HPI   [name] presents for intermittent frontal headaches and dont occur daily. Pain described "aggravating." Has rhinorrhea and left ear pain. Occasionally has popping in her ear.  They ease off and come back though.  Has been having them daily since Wednesday. She woke up with a headache this morning but the headache doesn't wake her up from sleep.  Tends to have headaches with allergies.  Last week, started having dizziness with laying down and getting up. No dizziness this week.  No chest pain or shortness of breath.  Took Tylenol with some relief.  Has slight cough. No diarrhea or vomiting. No known sick contacts.      Associated Symptoms Nausea/vomiting: no  Photophobia/phonophobia: no  Tearing of eyes: no  Sinus pain/pressure: yes  Personal or family hx migraine: no  Personal stressors: no    Fever: no  Neck pain/stiffness: no  Vision/speech/swallow/hearing difficulty: no  Focal weakness/numbness: no  Altered mental status: no  Trauma: no  New type of headache: yes  Anticoagulant use: no  H/o cancer/HIV: no     Past Medical History:  Diagnosis Date   Anxiety    Cholestanol storage disease    Chronic kidney disease    STAGE 3   Diabetes mellitus without complication (HCC)    Not taking medicine   GERD (gastroesophageal reflux disease)    High cholesterol    History of colon polyps    History of shingles    Hypertension    MRSA (methicillin resistant Staphylococcus aureus)    Osteoporosis    Rhinitis, allergic    Sleep apnea    C-PAP    Patient Active Problem List   Diagnosis Date Noted   Hx of adenomatous colonic polyps 11/13/2018   HLD (hyperlipidemia) 04/14/2018   Situational anxiety 01/29/2018   Generalized osteoarthritis of hand  07/04/2017   Elevated rheumatoid factor 06/14/2017   Pain of right hand 06/14/2017   Anxiety state 02/01/2016   Paresthesia 08/22/2015   Fibroid, uterine 06/21/2015   Cervical stenosis (uterine cervix) 06/21/2015   Atrophic vaginitis 01/31/2015   Edema 07/08/2014   Vitamin D deficiency 06/03/2014   Routine general medical examination at a health care facility 01/25/2014   Snoring 01/25/2014   Abnormal respiratory rate 01/25/2014   Chronic kidney disease (CKD), stage III (moderate) (HCC) 09/23/2013   Diabetes type 2, controlled (HCC) 08/17/2013   Hypertension 08/17/2013   Gastroesophageal reflux disease without esophagitis 08/17/2013   Morbid obesity (HCC) 08/17/2013   Left-sided low back pain with sciatica 08/17/2013   Severe obesity (BMI >= 40) (HCC) 08/17/2013    Past Surgical History:  Procedure Laterality Date   BACK SURGERY  07/12/10   Dr. Lovell Sheehan   COLONOSCOPY WITH PROPOFOL N/A 04/13/2015   Procedure: COLONOSCOPY WITH PROPOFOL;  Surgeon: Scot Jun, MD;  Location: [facility] ENDOSCOPY;  Service: Endoscopy;  Laterality: N/A;   COLONOSCOPY WITH PROPOFOL N/A 09/30/2019   Procedure: COLONOSCOPY WITH PROPOFOL;  Surgeon: Earline Mayotte, MD;  Location: [facility] ENDOSCOPY;  Service: Endoscopy;  Laterality: N/A;   DILATION AND CURETTAGE OF UTERUS     HYSTEROSCOPY WITH D & C N/A 07/11/2015   Procedure: DILATATION AND CURETTAGE /HYSTEROSCOPY;  Surgeon: Herold Harms, MD;  Location:  [facility] ORS;  Service: Gynecology;  Laterality: N/A;   KNEE SURGERY Right 08/01/12   Dr. Ernest Pine ,[facility]   TUBAL LIGATION     x2    OB History     Gravida  4   Para  1   Term  1   Preterm      AB  2   Living  2      SAB      IAB      Ectopic  2   Multiple      Live Births  2            Home Medications    Prior to Admission medications   Medication Sig Start Date End Date Taking? Authorizing Provider  azithromycin (ZITHROMAX Z-PAK) 250 MG tablet Take 2 tablets on day 1 then  1 tablet daily 10/26/22  Yes Pier Bosher, DO  potassium chloride (KLOR-CON M) 10 MEQ tablet Take by mouth. 08/13/22 08/13/23 Yes [provider]  predniSONE (STERAPRED UNI-PAK 21 TAB) 10 MG (21) TBPK tablet Take by mouth daily. Take 6 tabs by mouth daily for 1, then 5 tabs for 1 day, then 4 tabs for 1 day, then 3 tabs for 1 day, then 2 tabs for 1 day, then 1 tab for 1 day. 10/26/22  Yes Carrina Schoenberger, DO  allopurinol (ZYLOPRIM) 300 MG tablet Take 1 tablet (300 mg total) by mouth daily. 11/08/15   Sherrie Mustache Roselyn Bering, PA-C  amLODipine (NORVASC) 5 MG tablet Take 2 tablets (10 mg total) daily by mouth. 01/04/17   Fisher, Roselyn Bering, PA-C  baclofen (LIORESAL) 10 MG tablet Take 5 mg by mouth 2 (two) times daily as needed. 08/03/19   [provider]  Dexlansoprazole 30 MG capsule Take by mouth. 12/10/17   [provider]  esomeprazole (NEXIUM) 40 MG capsule Take 40 mg by mouth daily.    [provider]  fluticasone (FLONASE) 50 MCG/ACT nasal spray Place 2 sprays into both nostrils daily.    [provider]  gabapentin (NEURONTIN) 100 MG capsule Take 1 capsule (100 mg total) by mouth 2 (two) times daily. 09/12/21   Felecia Shelling, DPM  ipratropium (ATROVENT) 0.06 % nasal spray Place 2 sprays into both nostrils 4 (four) times daily. 05/17/22   Becky Augusta, NP  irbesartan (AVAPRO) 150 MG tablet Take 150 mg by mouth daily. 08/26/17   [provider]  lovastatin (MEVACOR) 40 MG tablet Take 0.5 tablets (20 mg total) by mouth at bedtime. 02/02/16   Allegra Grana, FNP  metoprolol succinate (TOPROL-XL) 25 MG 24 hr tablet Take 25 mg by mouth daily.    [provider]  montelukast (SINGULAIR) 10 MG tablet Take 1 tablet (10 mg total) by mouth at bedtime. 03/22/16   Allegra Grana, FNP  promethazine (PHENERGAN) 12.5 MG tablet Take 1 tablet (12.5 mg total) by mouth every 8 (eight) hours as needed for nausea or vomiting. 05/27/20   Felecia Shelling, DPM   promethazine-dextromethorphan (PROMETHAZINE-DM) 6.25-15 MG/5ML syrup Take 5 mLs by mouth 4 (four) times daily as needed. 05/17/22   Becky Augusta, NP    Family History Family History  Problem Relation Age of Onset   Cancer Father        Hodgkins   Diabetes Sister    Cancer Sister        brain tumor   Cancer Sister        breast   Breast cancer Sister 9   Colon  cancer Neg Hx    Ovarian cancer Neg Hx     Social History Social History   Tobacco Use   Smoking status: Former   Smokeless tobacco: Never   Tobacco comments:    quit 16 years ago   Vaping Use   Vaping status: Never Used  Substance Use Topics   Alcohol use: No   Drug use: No     Allergies   Gadolinium derivatives, Codeine, Escitalopram, Iodine, and Percocet [oxycodone-acetaminophen]   Review of Systems Review of Systems: negative unless otherwise stated in HPI.      Physical Exam Triage Vital Signs ED Triage Vitals  Encounter Vitals Group     BP 10/26/22 0841 (!) 151/86     Systolic BP Percentile --      Diastolic BP Percentile --      Pulse Rate 10/26/22 0841 66     Resp --      Temp 10/26/22 0846 98 F (36.7 C)     Temp Source 10/26/22 0846 Oral     SpO2 10/26/22 0841 98 %     Weight --      Height 10/26/22 0840 5\' 4"  (1.626 m)     Head Circumference --      Peak Flow --      Pain Score 10/26/22 0839 4     Pain Loc --      Pain Education --      Exclude from Growth Chart --    No data found.  Updated Vital Signs BP (!) 152/63 (BP Location: Left Arm)   Pulse 66   Temp 98 F (36.7 C) (Oral)   Ht 5\' 4"  (1.626 m)   SpO2 98%   BMI 41.54 kg/m   Visual Acuity Right Eye Distance:   Left Eye Distance:   Bilateral Distance:    Right Eye Near:   Left Eye Near:    Bilateral Near:     Physical Exam GEN:     alert, non-ill appearing elderly female and no distress    HENT:  mucus membranes moist, oropharyngeal without lesions or erythema , purulent nasal discharge on the right, clear  on the left, left maxillary and ethmoid sinus tenderness.  EYES:   pupils equal and reactive, EOM intact NECK:  supple, good ROM, no cervical spinous tenderness or paraspinal tenderness RESP:  clear to auscultation bilaterally, no increased work of breathing  CVS:   regular rate and rhythm, no murmur, distal pulses intact   EXT:   Good ROM of extremities, atraumatic NEURO:  alert, oriented, speech normal, CN 2-12 grossly intact, no facial droop,  sensation grossly intact, strength 5/5 bilateral UE and LE, normal coordination,  normal finger to nose, Negative Romberg , normal gait SKIN:   warm and dry, no rash, normal skin turgor      UC Treatments / Results  Labs (all labs ordered are listed, but only abnormal results are displayed) Labs Reviewed  GLUCOSE, CAPILLARY - Abnormal; Notable for the following components:      Result Value   Glucose-Capillary 101 (*)    All other components within normal limits  CBG MONITORING, ED    EKG  If EKG performed, see my interpretation in the MDM section  Radiology No results found.   Procedures Procedures (including critical care time)  Medications Ordered in UC Medications - No data to display  Initial Impression / Assessment and Plan / UC Course  I have reviewed the triage vital signs and the  nursing notes.  Pertinent labs & imaging results that were available during my care of the patient were reviewed by me and considered in my medical decision making (see chart for details).      Patient is a 67 y.o. female with history of T2DM, HTN, CKD Stage 3A, HLD, who presents for intermittent headache and dizziness for the past 2 weeks.    Differential diagnosis includes but is not limited to migraine headache, tension headache, cluster headache, CVA, depression, intracranial hemorrhage, meningitis/encephalitis, giant cell arteritis, idiopathic intracranial hypertension, dehydration and analgesia abuse. There are no red flag symptoms  associated with headache.  On chart review, there is no head imaging available.    Lindsey Caldwell is hypertensive here.  BP 152/63.  States she took her medicine around 7 AM this morning.  I suspect high blood pressure issue is secondary to pain. Takes Metoprolol, losartan and amlodipine.  Denies needing refills. Recommended she check herblood pressure and follow up with her primary care provider in the next 2 weeks.   Overall, patient is well-appearing, well-hydrated and in no acute distress.  Cardiopulmonary and neuro exams are unremarkable.  EKG obtained did not show any acute ST or T wave changes, normal sinus rhythm.  No murmurs heard on exam.  Suspect dizziness may be related to eustachian tube dysfunction on the left ear.  Prednisone taper will help with this.  As she reports history of sinus related headaches in the past we will treat with prednisone taper and azithromycin for presumed bacterial sinus infection given the duration of her symptoms, left ethmoid and maxillary tenderness.   Treat with headache with over-the-counter analgesics.  Advised to monitor use of Motrin given her CKD.  Advised to follow up with PCP.   ED precautions given for headache.  Discussed MDM, treatment plan and plan for follow-up with patient who agrees with plan.      Final Clinical Impressions(s) / UC Diagnoses   Final diagnoses:  Dizziness  Seasonal allergies  Elevated blood pressure reading with diagnosis of hypertension  Acute non-recurrent maxillary sinusitis  Eustachian tube dysfunction, left     Discharge Instructions      Stop by the pharmacy to pick up your prescriptions.  Follow up with your primary care provider as needed.  Go to ED for red flag symptoms, including; fevers you cannot reduce with Tylenol/Motrin, severe headaches, vision changes, numbness/weakness in part of the body, lethargy, confusion, intractable vomiting, severe dehydration, chest pain, breathing difficulty, severe  persistent abdominal or pelvic pain, signs of severe infection (increased redness, swelling of an area), feeling faint or passing out, dizziness, etc. You should especially go to the ED for sudden acute worsening of condition if you do not elect to go at this time.  Go to ED for red flag symptoms, including; fevers you cannot reduce with Tylenol/Motrin, severe headaches, vision changes, numbness/weakness in part of the body, lethargy, confusion, intractable vomiting, severe dehydration, chest pain, breathing difficulty, severe persistent abdominal or pelvic pain, signs of severe infection (increased redness, swelling of an area), feeling faint or passing out, dizziness, etc. You should especially go to the ED for sudden acute worsening of condition if you do not elect to go at this time.       ED Prescriptions     Medication Sig Dispense Auth. Provider   predniSONE (STERAPRED UNI-PAK 21 TAB) 10 MG (21) TBPK tablet Take by mouth daily. Take 6 tabs by mouth daily for 1, then 5 tabs for 1 day, then  4 tabs for 1 day, then 3 tabs for 1 day, then 2 tabs for 1 day, then 1 tab for 1 day. 21 tablet Renee Erb, DO   azithromycin (ZITHROMAX Z-PAK) 250 MG tablet Take 2 tablets on day 1 then 1 tablet daily 6 tablet Ronzell Laban, DO      PDMP not reviewed this encounter.   Katha Cabal, DO 10/26/22 1108

## 2023-01-08 ENCOUNTER — Other Ambulatory Visit: Payer: Self-pay | Admitting: Family Medicine

## 2023-01-08 DIAGNOSIS — Z1231 Encounter for screening mammogram for malignant neoplasm of breast: Secondary | ICD-10-CM

## 2023-02-07 ENCOUNTER — Ambulatory Visit
Admission: RE | Admit: 2023-02-07 | Discharge: 2023-02-07 | Disposition: A | Discharge status: Home / Self Care | Payer: Medicare HMO | Source: Ambulatory Visit | Attending: Family Medicine | Admitting: Family Medicine

## 2023-02-07 DIAGNOSIS — Z1231 Encounter for screening mammogram for malignant neoplasm of breast: Secondary | ICD-10-CM | POA: Diagnosis present

## 2023-09-17 ENCOUNTER — Encounter: Payer: Self-pay | Admitting: Podiatry

## 2023-09-17 ENCOUNTER — Ambulatory Visit: Admitting: Podiatry

## 2023-09-17 ENCOUNTER — Ambulatory Visit (INDEPENDENT_AMBULATORY_CARE_PROVIDER_SITE_OTHER)

## 2023-09-17 VITALS — Ht 64.0 in | Wt 242.0 lb

## 2023-09-17 DIAGNOSIS — M7752 Other enthesopathy of left foot: Secondary | ICD-10-CM

## 2023-09-17 DIAGNOSIS — M65972 Unspecified synovitis and tenosynovitis, left ankle and foot: Secondary | ICD-10-CM | POA: Diagnosis not present

## 2023-09-17 MED ORDER — BETAMETHASONE SOD PHOS & ACET 6 (3-3) MG/ML IJ SUSP
3.0000 mg | Freq: Once | INTRAMUSCULAR | Status: AC
  Administered 2023-09-17: 3 mg via INTRA_ARTICULAR

## 2023-09-17 NOTE — Progress Notes (Signed)
 Chief Complaint  Patient presents with   Foot Pain    Pt is here due to left foot pain she states the pain started a few months ago while walking the track, thinks she step wrong and has been in pain on and off since, states no injury, states the whole foot hurts, some mornings she can not bear weight due to pain.    HPI: 68 y.o. female presenting today for acute onset of left ankle pain ongoing for about 2 months.  Initially started when she was walking for exercise.  No injury.  It continues to be painful on a daily basis.  Past Medical History:  Diagnosis Date   Anxiety    Cholestanol storage disease    Chronic kidney disease    STAGE 3   Diabetes mellitus without complication (HCC)    Not taking medicine   GERD (gastroesophageal reflux disease)    High cholesterol    History of colon polyps    History of shingles    Hypertension    MRSA (methicillin resistant Staphylococcus aureus)    Osteoporosis    Rhinitis, allergic    Sleep apnea    C-PAP    Past Surgical History:  Procedure Laterality Date   BACK SURGERY  07/12/10   Dr. Mavis   COLONOSCOPY WITH PROPOFOL  N/A 04/13/2015   Procedure: COLONOSCOPY WITH PROPOFOL ;  Surgeon: Lamar ONEIDA Holmes, MD;  Location: [facility] ENDOSCOPY;  Service: Endoscopy;  Laterality: N/A;   COLONOSCOPY WITH PROPOFOL  N/A 09/30/2019   Procedure: COLONOSCOPY WITH PROPOFOL ;  Surgeon: Dessa Reyes ORN, MD;  Location: [facility] ENDOSCOPY;  Service: Endoscopy;  Laterality: N/A;   DILATION AND CURETTAGE OF UTERUS     HYSTEROSCOPY WITH D & C N/A 07/11/2015   Procedure: DILATATION AND CURETTAGE /HYSTEROSCOPY;  Surgeon: Gladis DELENA Dollar, MD;  Location: [facility] ORS;  Service: Gynecology;  Laterality: N/A;   KNEE SURGERY Right 08/01/12   Dr. Mardee ,[facility]   TUBAL LIGATION     x2    Allergies  Allergen Reactions   Gadolinium Derivatives Hives    Hives on chest/redness/itching after 19 cc multihance -benadryl was given per radiologist Hives on  chest/redness/itching after 19 cc multihance -benadryl was given per radiologist   Codeine Nausea And Vomiting   Escitalopram Other (See Comments)    Excessive sweating   Iodine Hives   Percocet [Oxycodone-Acetaminophen ] Nausea And Vomiting     Physical Exam: General: The patient is alert and oriented x3 in no acute distress.  Dermatology: Skin is warm, dry and supple bilateral lower extremities.   Vascular: Palpable pedal pulses bilaterally. Capillary refill within normal limits.  No appreciable edema.  No erythema.  Neurological: Grossly intact via light touch  Musculoskeletal Exam: Tenderness to palpation overlying the anterolateral aspect of the left ankle.  Range of motion WNL.  There is also tenderness throughout palpation along the lateral column of the foot and diffusely throughout the foot in general  Radiographic Exam LT foot 09/17/2023:  Normal osseous mineralization. Joint spaces preserved.  Degenerative changes noted around the 4th and 5th TMT consistent with advanced arthritis.  No acute fracture identified  Assessment/Plan of Care: 1.  Synovitis anterolateral aspect of the left ankle 2.  DJD/arthritis left foot  -Patient evaluated -Injection of 0.5 cc Celestone  Soluspan injected around the anterolateral aspect of the left ankle -Continue Tylenol  arthritis.  Unable to take NSAIDs secondary to CKD -Continue wearing custom molded orthotics with good supportive tennis shoes -Return to clinic PRN  Thresa EMERSON Sar, DPM [facility]  Dr. Thresa EMERSON Sar, DPM    [address] [place], [place] [address]                Office [phone]  Fax [phone]

## 2023-10-29 ENCOUNTER — Ambulatory Visit: Admitting: Podiatry

## 2023-10-29 DIAGNOSIS — R52 Pain, unspecified: Secondary | ICD-10-CM | POA: Diagnosis not present

## 2023-10-29 DIAGNOSIS — M19071 Primary osteoarthritis, right ankle and foot: Secondary | ICD-10-CM

## 2023-10-29 MED ORDER — BETAMETHASONE SOD PHOS & ACET 6 (3-3) MG/ML IJ SUSP
3.0000 mg | Freq: Once | INTRAMUSCULAR | Status: AC
  Administered 2023-10-29: 3 mg via INTRA_ARTICULAR

## 2023-10-29 NOTE — Progress Notes (Signed)
 Chief Complaint  Patient presents with   Foot Pain    Right foot pain at the joint where the foot and ankle join. 8/10 pain scale. [facility] visit around labor day.     HPI: 68 y.o. female presenting today for new onset/complaint of pain and tenderness associated to the right ankle joint.  Last visit 09/17/2023 she was experiencing severe left ankle joint pain.  She received a cortisone injection which helped alleviate a lot of her symptoms and pain.  Most recently she began to develop right ankle pain with swelling.  She went to [facility] and a cam boot was dispensed as well as a prednisone  pack.  She has had minimal relief.  Past Medical History:  Diagnosis Date   Anxiety    Cholestanol storage disease    Chronic kidney disease    STAGE 3   Diabetes mellitus without complication (HCC)    Not taking medicine   GERD (gastroesophageal reflux disease)    High cholesterol    History of colon polyps    History of shingles    Hypertension    MRSA (methicillin resistant Staphylococcus aureus)    Osteoporosis    Rhinitis, allergic    Sleep apnea    C-PAP    Past Surgical History:  Procedure Laterality Date   BACK SURGERY  07/12/10   Dr. Mavis   COLONOSCOPY WITH PROPOFOL  N/A 04/13/2015   Procedure: COLONOSCOPY WITH PROPOFOL ;  Surgeon: Lamar ONEIDA Holmes, MD;  Location: [facility] ENDOSCOPY;  Service: Endoscopy;  Laterality: N/A;   COLONOSCOPY WITH PROPOFOL  N/A 09/30/2019   Procedure: COLONOSCOPY WITH PROPOFOL ;  Surgeon: Dessa Reyes ORN, MD;  Location: [facility] ENDOSCOPY;  Service: Endoscopy;  Laterality: N/A;   DILATION AND CURETTAGE OF UTERUS     HYSTEROSCOPY WITH D & C N/A 07/11/2015   Procedure: DILATATION AND CURETTAGE /HYSTEROSCOPY;  Surgeon: Gladis DELENA Dollar, MD;  Location: [facility] ORS;  Service: Gynecology;  Laterality: N/A;   KNEE SURGERY Right 08/01/12   Dr. Mardee ,[facility]   TUBAL LIGATION     x2    Allergies  Allergen Reactions   Gadolinium Derivatives Hives    Hives on  chest/redness/itching after 19 cc multihance -benadryl was given per radiologist Hives on chest/redness/itching after 19 cc multihance -benadryl was given per radiologist   Codeine Nausea And Vomiting   Escitalopram Other (See Comments)    Excessive sweating   Iodine Hives   Percocet [Oxycodone-Acetaminophen ] Nausea And Vomiting     Physical Exam: General: The patient is alert and oriented x3 in no acute distress.  Dermatology: Skin is warm, dry and supple bilateral lower extremities.   Vascular: Palpable pedal pulses bilaterally. Capillary refill within normal limits.  No erythema.  Moderate edema noted around the right ankle joint  Neurological: Grossly intact via light touch  Musculoskeletal Exam: The left ankle is asymptomatic today.  There is significant tenderness throughout palpation to the right ankle joint.  No crepitus with palpation or range of motion.  There is some associated edema noted around the ankle joint as well.  10/19/2023 text/html  Patient presents today for evaluation of insidious right ankle pain and swelling worsening since yesterday. No injury or inciting event. No long travel, tobacco use, history of blood clots. She does have a history of CKD. She is prediabetic. She is not on blood thinners. No calf tenderness. No shortness of breath or trouble breathing. She does have a history of gout. She reports she has had a similar instance in the past.  3 views of the right ankle demonstrates no fracture or other acute bony abnormality. Soft tissue swelling.    Assessment/Plan of Care: 1.  Synovitis anterolateral aspect of the left ankle; resolved 2.  Chronic DJD/arthritis left foot 3.  Arthritis w/ synovitis right ankle  -Patient evaluated -Injection of 0.5 cc Celestone  Soluspan injected right ankle joint -Patient was prescribed Medrol  Dosepak from [facility].  She has not taken it yet.  Recommend taking as prescribed -Continue Tylenol  arthritis.  Unable to take  NSAIDs secondary to CKD -Continue wearing custom molded orthotics with good supportive tennis shoes -Return to clinic PRN     Thresa EMERSON Sar, DPM [facility]  Dr. Thresa EMERSON Sar, DPM    [address] [place], [place] [address]                Office [phone]  Fax [phone]

## 2023-11-19 ENCOUNTER — Ambulatory Visit: Admitting: Podiatry

## 2023-11-19 ENCOUNTER — Encounter: Payer: Self-pay | Admitting: Podiatry

## 2023-11-19 VITALS — Ht 64.0 in | Wt 242.0 lb

## 2023-11-19 DIAGNOSIS — M19071 Primary osteoarthritis, right ankle and foot: Secondary | ICD-10-CM

## 2023-11-19 NOTE — Progress Notes (Signed)
 Chief Complaint  Patient presents with   Foot Pain    Pt is here to f/u on right foot and ankle pain, states its a little better then before.    HPI: 68 y.o. female presenting today for follow-up evaluation of pain and tenderness associated to the right ankle joint.  Significant improvement.  Past Medical History:  Diagnosis Date   Anxiety    Cholestanol storage disease    Chronic kidney disease    STAGE 3   Diabetes mellitus without complication (HCC)    Not taking medicine   GERD (gastroesophageal reflux disease)    High cholesterol    History of colon polyps    History of shingles    Hypertension    MRSA (methicillin resistant Staphylococcus aureus)    Osteoporosis    Rhinitis, allergic    Sleep apnea    C-PAP    Past Surgical History:  Procedure Laterality Date   BACK SURGERY  07/12/10   Dr. Mavis   COLONOSCOPY WITH PROPOFOL  N/A 04/13/2015   Procedure: COLONOSCOPY WITH PROPOFOL ;  Surgeon: Lamar ONEIDA Holmes, MD;  Location: [facility] ENDOSCOPY;  Service: Endoscopy;  Laterality: N/A;   COLONOSCOPY WITH PROPOFOL  N/A 09/30/2019   Procedure: COLONOSCOPY WITH PROPOFOL ;  Surgeon: Dessa Reyes ORN, MD;  Location: [facility] ENDOSCOPY;  Service: Endoscopy;  Laterality: N/A;   DILATION AND CURETTAGE OF UTERUS     HYSTEROSCOPY WITH D & C N/A 07/11/2015   Procedure: DILATATION AND CURETTAGE /HYSTEROSCOPY;  Surgeon: Gladis DELENA Dollar, MD;  Location: [facility] ORS;  Service: Gynecology;  Laterality: N/A;   KNEE SURGERY Right 08/01/12   Dr. Mardee ,[facility]   TUBAL LIGATION     x2    Allergies  Allergen Reactions   Gadolinium Derivatives Hives    Hives on chest/redness/itching after 19 cc multihance -benadryl was given per radiologist Hives on chest/redness/itching after 19 cc multihance -benadryl was given per radiologist   Codeine Nausea And Vomiting   Escitalopram Other (See Comments)    Excessive sweating   Iodine Hives   Percocet [Oxycodone-Acetaminophen ] Nausea And Vomiting      Physical Exam: General: The patient is alert and oriented x3 in no acute distress.  Dermatology: Skin is warm, dry and supple bilateral lower extremities.   Vascular: Palpable pedal pulses bilaterally. Capillary refill within normal limits.  No erythema.  Moderate edema noted around the right ankle joint  Neurological: Grossly intact via light touch  Musculoskeletal Exam: Today there is no tenderness or pain with palpation or range of motion to the bilateral foot and ankle  10/19/2023 text/html  Patient presents today for evaluation of insidious right ankle pain and swelling worsening since yesterday. No injury or inciting event. No long travel, tobacco use, history of blood clots. She does have a history of CKD. She is prediabetic. She is not on blood thinners. No calf tenderness. No shortness of breath or trouble breathing. She does have a history of gout. She reports she has had a similar instance in the past.  3 views of the right ankle demonstrates no fracture or other acute bony abnormality. Soft tissue swelling.    Assessment/Plan of Care: 1.  Synovitis anterolateral aspect of the left ankle; resolved 2.  Chronic DJD/arthritis left foot 3.  Arthritis w/ synovitis right ankle  -Patient evaluated -Continue Tylenol  arthritis.  Unable to take NSAIDs secondary to CKD -Continue wearing OTC power step insoles with good supportive tennis shoes -Return to clinic PRN     Thresa EMERSON Sar, DPM [facility]  Dr. Thresa EMERSON Sar, DPM    [address] [place], [place] [address]                Office [phone]  Fax [phone]

## 2024-01-20 ENCOUNTER — Other Ambulatory Visit: Payer: Self-pay | Admitting: Family Medicine

## 2024-01-20 DIAGNOSIS — Z1231 Encounter for screening mammogram for malignant neoplasm of breast: Secondary | ICD-10-CM

## 2024-02-19 ENCOUNTER — Ambulatory Visit

## 2024-02-27 ENCOUNTER — Ambulatory Visit
Admission: RE | Admit: 2024-02-27 | Discharge: 2024-02-27 | Disposition: A | Discharge status: Home / Self Care | Source: Ambulatory Visit | Attending: Family Medicine | Admitting: Family Medicine

## 2024-02-27 DIAGNOSIS — Z1231 Encounter for screening mammogram for malignant neoplasm of breast: Secondary | ICD-10-CM | POA: Insufficient documentation
